# Patient Record
Sex: Male | Born: 1980 | Race: White | Hispanic: No | Marital: Married | Smoking: Never smoker
Health system: Southern US, Community
[De-identification: ages and names within clinical notes are randomized; demographics above are authoritative.]

## PROBLEM LIST (undated history)

## (undated) DIAGNOSIS — N200 Calculus of kidney: Secondary | ICD-10-CM

## (undated) HISTORY — PX: ORCHIECTOMY: SHX2116

---

## 2002-04-30 HISTORY — PX: LITHOTRIPSY: SUR834

## 2018-01-14 ENCOUNTER — Encounter: Payer: Self-pay | Admitting: Emergency Medicine

## 2018-01-14 ENCOUNTER — Emergency Department
Admission: EM | Admit: 2018-01-14 | Discharge: 2018-01-14 | Disposition: A | Discharge status: Home / Self Care | Payer: Self-pay | Attending: Emergency Medicine | Admitting: Emergency Medicine

## 2018-01-14 ENCOUNTER — Emergency Department: Payer: Self-pay

## 2018-01-14 DIAGNOSIS — N23 Unspecified renal colic: Secondary | ICD-10-CM | POA: Insufficient documentation

## 2018-01-14 HISTORY — DX: Calculus of kidney: N20.0

## 2018-01-14 LAB — CBC
HCT: 43.3 % (ref 40.0–52.0)
Hemoglobin: 14.6 g/dL (ref 13.0–18.0)
MCH: 29.7 pg (ref 26.0–34.0)
MCHC: 33.7 g/dL (ref 32.0–36.0)
MCV: 88 fL (ref 80.0–100.0)
PLATELETS: 213 10*3/uL (ref 150–440)
RBC: 4.92 MIL/uL (ref 4.40–5.90)
RDW: 14.5 % (ref 11.5–14.5)
WBC: 11.8 10*3/uL — ABNORMAL HIGH (ref 3.8–10.6)

## 2018-01-14 LAB — COMPREHENSIVE METABOLIC PANEL
ALK PHOS: 66 U/L (ref 38–126)
ALT: 26 U/L (ref 0–44)
AST: 23 U/L (ref 15–41)
Albumin: 4.4 g/dL (ref 3.5–5.0)
Anion gap: 11 (ref 5–15)
BUN: 18 mg/dL (ref 6–20)
CALCIUM: 9.4 mg/dL (ref 8.9–10.3)
CO2: 26 mmol/L (ref 22–32)
CREATININE: 1.06 mg/dL (ref 0.61–1.24)
Chloride: 102 mmol/L (ref 98–111)
GFR calc non Af Amer: >60 mL/min (ref >60)
GLUCOSE: 99 mg/dL (ref 70–99)
Potassium: 3.8 mmol/L (ref 3.5–5.1)
SODIUM: 139 mmol/L (ref 135–145)
Total Bilirubin: 0.7 mg/dL (ref 0.3–1.2)
Total Protein: 7.9 g/dL (ref 6.5–8.1)

## 2018-01-14 LAB — URINALYSIS, COMPLETE (UACMP) WITH MICROSCOPIC
Bacteria, UA: NONE SEEN
Bilirubin Urine: NEGATIVE
Glucose, UA: NEGATIVE mg/dL
Ketones, ur: NEGATIVE mg/dL
NITRITE: NEGATIVE
Protein, ur: 30 mg/dL — AB
RBC / HPF: >50 RBC/hpf — ABNORMAL HIGH (ref 0–5)
SPECIFIC GRAVITY, URINE: 1.025 (ref 1.005–1.030)
Squamous Epithelial / LPF: NONE SEEN (ref 0–5)
pH: 6 (ref 5.0–8.0)

## 2018-01-14 MED ORDER — ONDANSETRON HCL 4 MG/2ML IJ SOLN
4.0000 mg | Freq: Once | INTRAMUSCULAR | Status: AC
  Administered 2018-01-14: 4 mg via INTRAVENOUS
  Filled 2018-01-14: qty 2

## 2018-01-14 MED ORDER — TAMSULOSIN HCL 0.4 MG PO CAPS: 0.4000 mg | ORAL_CAPSULE | Freq: Every day | ORAL | 0 refills | Status: DC

## 2018-01-14 MED ORDER — OXYCODONE-ACETAMINOPHEN 5-325 MG PO TABS: 1.0000 | ORAL_TABLET | Freq: Three times a day (TID) | ORAL | 0 refills | Status: DC | PRN

## 2018-01-14 MED ORDER — MORPHINE SULFATE (PF) 4 MG/ML IV SOLN
4.0000 mg | Freq: Once | INTRAVENOUS | Status: AC
  Administered 2018-01-14: 4 mg via INTRAVENOUS
  Filled 2018-01-14: qty 1

## 2018-01-14 MED ORDER — ONDANSETRON 4 MG PO TBDP: 4.0000 mg | ORAL_TABLET | Freq: Three times a day (TID) | ORAL | 0 refills | Status: DC | PRN

## 2018-01-14 MED ORDER — KETOROLAC TROMETHAMINE 30 MG/ML IJ SOLN
30.0000 mg | Freq: Once | INTRAMUSCULAR | Status: AC
  Administered 2018-01-14: 30 mg via INTRAVENOUS
  Filled 2018-01-14: qty 1

## 2018-01-14 NOTE — ED Notes (Addendum)
Water given. OK per EDP.  

## 2018-01-14 NOTE — ED Notes (Signed)
Pt ambulatory to treatment room with no distress noted. Family with pt.

## 2018-01-14 NOTE — ED Notes (Signed)
Pt returned from CT °

## 2018-01-14 NOTE — ED Notes (Signed)
Pt taken ambulatory to xray

## 2018-01-14 NOTE — ED Notes (Signed)
Pt taken to CT at this time.

## 2018-01-14 NOTE — ED Provider Notes (Signed)
[facility] Emergency Department Provider Note       Time seen: ----------------------------------------- 10:45 AM on 01/14/2018 -----------------------------------------   I have reviewed the triage vital signs and the nursing notes.  HISTORY   Chief Complaint Back Pain    HPI Alex Baldwin is a 37 y.o. male with a history of kidney stones who presents to the ED for left flank pain.  Patient reports back pain on his left side for the past several days.  He reports history of kidney stones and feels certain that he has another one.  He describes sharp pain in the left side with nausea although he is not nauseous currently.  Nothing makes his pain better.  Past Medical History:  Diagnosis Date  . Kidney stones     There are no active problems to display for this patient.   History reviewed. No pertinent surgical history.  Allergies Patient has no allergy information on record.  Social History Social History   Tobacco Use  . Smoking status: Not on file  Substance Use Topics  . Alcohol use: Not on file  . Drug use: Not on file   Review of Systems Constitutional: Negative for fever. Cardiovascular: Negative for chest pain. Respiratory: Negative for shortness of breath. Gastrointestinal: Positive for flank pain Genitourinary: Negative for dysuria. Musculoskeletal: Positive for back pain Skin: Negative for rash. Neurological: Negative for headaches, focal weakness or numbness.  All systems negative/normal/unremarkable except as stated in the HPI  ____________________________________________   PHYSICAL EXAM:  VITAL SIGNS: ED Triage Vitals [01/14/18 1007]  Enc Vitals Group     BP 114/88     Pulse Rate 65     Resp 20     Temp 97.8 F (36.6 C)     Temp Source Oral     SpO2 100 %     Weight 210 lb (95.3 kg)     Height 5\' 9"  (1.753 m)     Head Circumference      Peak Flow      Pain Score 6     Pain Loc      Pain Edu?      Excl.  in [place]?    Constitutional: Alert and oriented.  Mild distress from pain Eyes: Conjunctivae are normal. Normal extraocular movements. Cardiovascular: Normal rate, regular rhythm. No murmurs, rubs, or gallops. Respiratory: Normal respiratory effort without tachypnea nor retractions. Breath sounds are clear and equal bilaterally. No wheezes/rales/rhonchi. Gastrointestinal: Mild left flank tenderness, Normal bowel sounds. Musculoskeletal: Nontender with normal range of motion in extremities. No lower extremity tenderness nor edema. Neurologic:  Normal speech and language. No gross focal neurologic deficits are appreciated.  Skin:  Skin is warm, dry and intact. No rash noted. Psychiatric: Mood and affect are normal. Speech and behavior are normal.  ____________________________________________  ED COURSE:  As part of my medical decision making, I reviewed the following data within the [place] History obtained from family if available, nursing notes, old chart and ekg, as well as notes from prior ED visits. Patient presented for flank pain, we will assess with labs and imaging as indicated at this time.   Procedures ____________________________________________   LABS (pertinent positives/negatives)  Labs Reviewed  URINALYSIS, COMPLETE (UACMP) WITH MICROSCOPIC - Abnormal; Notable for the following components:      Result Value   Color, Urine YELLOW (*)    APPearance CLEAR (*)    Hgb urine dipstick LARGE (*)    Protein, ur 30 (*)  Leukocytes, UA TRACE (*)    RBC / HPF >50 (*)    All other components within normal limits  CBC - Abnormal; Notable for the following components:   WBC 11.8 (*)    All other components within normal limits  COMPREHENSIVE METABOLIC PANEL    RADIOLOGY Images were viewed by me  Abdomen 2 view IMPRESSION: 1. A 10.4 mm calcific density again noted over the left mid flank consistent with a mid left ureteral stone.  2.  Right  nephrolithiasis. IMPRESSION: 1. Moderate left hydronephrosis caused by a mid proximal left ureteral calculus of 7 x 9 mm. 2. Bilateral renal calculi the larger and more numerous on the right. ____________________________________________  DIFFERENTIAL DIAGNOSIS   Renal colic, UTI, pyelonephritis, muscle strain  FINAL ASSESSMENT AND PLAN  Renal colic   Plan: The patient had presented for flank pain. Patient's labs revealed hematuria as expected and mild leukocytosis but was otherwise unremarkable. Patient's imaging did reveal a very large kidney stone of 7 x 9 mm which it is doubtful that he will pass.  I will discuss with urology and refer for outpatient follow-up with symptomatic treatment.   Laurence Aly, MD   Note: This note was generated in part or whole with voice recognition software. Voice recognition is usually quite accurate but there are transcription errors that can and very often do occur. I apologize for any typographical errors that were not detected and corrected.     Earleen Newport, MD 01/14/18 [phone]

## 2018-01-14 NOTE — ED Triage Notes (Signed)
Pt reports back pain to his lower left side for several days. Pt states has a hx of kidney stones and is pretty certain that he has another kidney stone.

## 2018-01-14 NOTE — ED Notes (Signed)
Pt given saltine crackers. 

## 2018-01-20 ENCOUNTER — Ambulatory Visit (INDEPENDENT_AMBULATORY_CARE_PROVIDER_SITE_OTHER): Payer: Self-pay | Admitting: Urology

## 2018-01-20 ENCOUNTER — Other Ambulatory Visit: Payer: Self-pay | Admitting: Radiology

## 2018-01-20 ENCOUNTER — Encounter: Payer: Self-pay | Admitting: Urology

## 2018-01-20 VITALS — BP 117/82 | HR 83 | Ht 69.0 in | Wt 201.4 lb

## 2018-01-20 DIAGNOSIS — N200 Calculus of kidney: Secondary | ICD-10-CM

## 2018-01-20 DIAGNOSIS — N132 Hydronephrosis with renal and ureteral calculous obstruction: Secondary | ICD-10-CM

## 2018-01-20 DIAGNOSIS — N201 Calculus of ureter: Secondary | ICD-10-CM

## 2018-01-20 DIAGNOSIS — R3129 Other microscopic hematuria: Secondary | ICD-10-CM

## 2018-01-20 LAB — URINALYSIS, COMPLETE
Bilirubin, UA: NEGATIVE
GLUCOSE, UA: NEGATIVE
KETONES UA: NEGATIVE
NITRITE UA: NEGATIVE
Protein, UA: NEGATIVE
Specific Gravity, UA: 1.015 (ref 1.005–1.030)
Urobilinogen, Ur: 0.2 mg/dL (ref 0.2–1.0)
pH, UA: 6.5 (ref 5.0–7.5)

## 2018-01-20 LAB — MICROSCOPIC EXAMINATION

## 2018-01-20 MED ORDER — OXYCODONE-ACETAMINOPHEN 5-325 MG PO TABS: 1.0000 | ORAL_TABLET | Freq: Three times a day (TID) | ORAL | 0 refills | Status: DC | PRN

## 2018-01-20 NOTE — Progress Notes (Signed)
01/20/2018 9:08 AM   Burnetta Sabin 12-20-1980 [id number]  Referring provider: No referring provider defined for this encounter.  Chief Complaint  Patient presents with  . Nephrolithiasis    follow up    HPI: Patient is a 37 year old Caucasian male who was referred by [facility] ED for nephrolithiasis.    He was seen in the ED on 01/14/2018 for the complaint of left flank pain and nausea.  CT Renal stone revealed moderate left hydronephrosis caused by a mid proximal left ureteral calculus of 7 x 9 mm.  Bilateral renal calculi the larger and more numerous on the right.    Labs in the ED:  UA was positive for > 50 RBC's and 11-20 WBC's, serum creatinine was 1.06 and WBC count was 11.8.     Meds given in the ED: He was given Toradol IM, morphine IM, Zofran IM and p.o., tamsulosin 0.4 mg and Percocet.  Prior urological history:  ESWL in 2004 in [place].  He is also passing stones spontaneously.     Current NSAID/anticoagulation:  None   Today, he last had left flank pain yesterday afternoon.  He is controlling the pain with the Percocet.  He is having gross hematuria.  He is having associated frequency and nocturia with the increase in water.    His stone composition is unknown.    His UA is positive for 6-10 WBC's, 11-30 RBC's and a few bacteria.     PMH: Past Medical History:  Diagnosis Date  . Kidney stones     Surgical History: Past Surgical History:  Procedure Laterality Date  . LITHOTRIPSY  2004    Home Medications:  Allergies as of 01/20/2018   Not on File     Medication List        Accurate as of 01/20/18 11:59 PM. Always use your most recent med list.          ondansetron 4 MG disintegrating tablet Commonly known as:  ZOFRAN-ODT Take 1 tablet (4 mg total) by mouth every 8 (eight) hours as needed for nausea or vomiting.   oxyCODONE-acetaminophen 5-325 MG tablet Commonly known as:  PERCOCET/ROXICET Take 1 tablet by mouth every 8 (eight) hours as  needed.   tamsulosin 0.4 MG Caps capsule Commonly known as:  FLOMAX Take 1 capsule (0.4 mg total) by mouth daily after breakfast.       Allergies: Not on File  Family History: No family history on file.  Social History:  reports that he has never smoked. He has never used smokeless tobacco. He reports that he does not drink alcohol. His drug history is not on file.  ROS: UROLOGY Frequent Urination?: Yes Hard to postpone urination?: No Burning/pain with urination?: No Get up at night to urinate?: Yes Leakage of urine?: No Urine stream starts and stops?: No Trouble starting stream?: No Do you have to strain to urinate?: No Blood in urine?: Yes Urinary tract infection?: No Sexually transmitted disease?: No Injury to kidneys or bladder?: No Painful intercourse?: No Weak stream?: No Erection problems?: No Penile pain?: No  Gastrointestinal Nausea?: Yes Vomiting?: Yes Indigestion/heartburn?: No Diarrhea?: No Constipation?: No  Constitutional Fever: No Night sweats?: No Weight loss?: No Fatigue?: No  Skin Skin rash/lesions?: No Itching?: No  Eyes Blurred vision?: No Double vision?: No  Ears/Nose/Throat Sore throat?: No Sinus problems?: No  Hematologic/Lymphatic Swollen glands?: No Easy bruising?: No  Cardiovascular Leg swelling?: No Chest pain?: No  Respiratory Cough?: Yes Shortness of breath?: No  Endocrine  Excessive thirst?: No  Musculoskeletal Back pain?: Yes Joint pain?: No  Neurological Headaches?: No Dizziness?: No  Psychologic Depression?: No Anxiety?: No  Physical Exam: BP 117/82 (BP Location: Left Arm, Patient Position: Sitting, Cuff Size: Normal)   Pulse 83   Ht _0  (1.753 m)   Wt 201 lb 6.4 oz (91.4 kg)   BMI 29.74 kg/m   Constitutional:  Well nourished. Alert and oriented, No acute distress. HEENT: [place] AT, moist mucus membranes.  Trachea midline, no masses. Cardiovascular: No clubbing, cyanosis, or  edema. Respiratory: Normal respiratory effort, no increased work of breathing. GI: Abdomen is soft, non tender, non distended, no abdominal masses. Liver and spleen not palpable.  No hernias appreciated.  Stool sample for occult testing is not indicated.   GU: No CVA tenderness.  No bladder fullness or masses.   Skin: No rashes, bruises or suspicious lesions. Lymph: No cervical or inguinal adenopathy. Neurologic: Grossly intact, no focal deficits, moving all 4 extremities. Psychiatric: Normal mood and affect.  Laboratory Data: Lab Results  Component Value Date   WBC 11.8 (H) 01/14/2018   HGB 14.6 01/14/2018   HCT 43.3 01/14/2018   MCV 88.0 01/14/2018   PLT 213 01/14/2018    Lab Results  Component Value Date   CREATININE 1.06 01/14/2018    No results found for: PSA  No results found for: TESTOSTERONE  No results found for: HGBA1C  No results found for: TSH  No results found for: CHOL, HDL, CHOLHDL, VLDL, LDLCALC  Lab Results  Component Value Date   AST 23 01/14/2018   Lab Results  Component Value Date   ALT 26 01/14/2018   No components found for: ALKALINEPHOPHATASE No components found for: BILIRUBINTOTAL  No results found for: ESTRADIOL  Urinalysis 6-10 WBC's and 11-30 RBC's.  See Epic.    I have reviewed the labs.   Pertinent Imaging: CLINICAL DATA:  Back pain radiating to the left for several days, history of kidney stone, no injury  EXAM: CT ABDOMEN AND PELVIS WITHOUT CONTRAST  TECHNIQUE: Multidetector CT imaging of the abdomen and pelvis was performed following the standard protocol without IV contrast.  COMPARISON:  Abdomen films of 01/14/2017  FINDINGS: Lower chest: There is linear scarring or atelectasis in the left lower lobe anteriorly. No pneumonia or pleural effusion is seen. The heart is within normal limits in size.  Hepatobiliary: The liver is unremarkable in the unenhanced state. No calcified gallstones are  seen.  Pancreas: The pancreas is normal in size and the pancreatic duct is not dilated.  Spleen: The spleen is unremarkable.  Adrenals/Urinary Tract: The adrenal glands appear normal. Bilateral renal calculi are present. The larger calculi on the left measure 9 mm in diameter in the mid and in the lower pole collecting system. No right hydronephrosis is seen. Only small left renal calculi are noted. However there is moderate left hydronephrosis present with hydroureter to point of obstruction by a 7 x 9 mm mid proximal ureteral calculus. Otherwise the distal ureters are normal in caliber. The urinary bladder is not well distended.  Stomach/Bowel: The stomach is distended with fluid and food debris. No abnormality of the small bowel is seen. There is some feces throughout the colon in the left colon is largely decompressed. The terminal ileum is unremarkable. The appendix is not well seen but no inflammatory process is noted within the right lower quadrant.  Vascular/Lymphatic: The abdominal aorta is normal in caliber. No adenopathy is seen.  Reproductive: The prostate is  normal in size.  Other: No abdominal wall hernia is noted.  Musculoskeletal: The lumbar vertebrae are in normal alignment with normal intervertebral disc spaces.  IMPRESSION: 1. Moderate left hydronephrosis caused by a mid proximal left ureteral calculus of 7 x 9 mm. 2. Bilateral renal calculi the larger and more numerous on the right.   Electronically Signed   By: Ivar Drape M.D.   On: 01/14/2018 11:39  I have independently reviewed the films and reviewed case/films with Dr. Diamantina Providence   Assessment & Plan:   Patient is a 37 year old Caucasian male with an obstructing left proximal ureteral calculi associated with bilateral renal calculi who is planned to undergo a bilateral ureteroscopy laser lithotripsy and bilateral ureteral stent placement for definitive treatment.   1. Left ureteral  stone We discussed that since the stone is ?10 mm, they may choose to continue observation and/or MET with ?-blockers  We discussed ESWL would be the procedure with the least morbidity and lowest complication rate, but URS has a greater stone-free rate in a single procedure We dicussed that since the stone is in the mid or distal ureter, URS would be first-line therapy, but ESWL is still an option URS is recommended for patients with suspected cystine or uric acid ureteral stones who fail MET or desire intervention  Due to financial concerns, patient would like to proceed with bilateral ureteroscopy with laser lithotripsy and bilateral ureteral stent placement Explained to the patient how the procedure is performed and the risks involved Informed patient that they will have a stent placed during the procedure and will remain in place after the procedure for a short time.  Stent may be removed in the office with a cystoscope or patient may be instructed to remove the stent themselves by the string Described "stent pain" as feelings of needing to urinate/overactive bladder and a warm, tingling sensation to intense pain in the affected flank Residual stones within the kidney or ureter may be present after the procedure and may need to have these addressed at a different encounter Injury to the ureter is the most common intra-operative risk, it may result in an open procedure to correct the defect Infection and bleeding are also risks Explained the risks of general anesthesia, such as: MI, CVA, paralysis, coma and/or death. Advised to contact our office or seek treatment in the ED if becomes febrile or pain/ vomiting are difficult control in order to arrange for emergent/urgent intervention Reviewing the [facility] controlled substance website his last narcotic prescription was January 14, 2018.  I have given a prescription for Percocet 5/325, #21 every 8 hours as needed for pain.  I advised him  only to take the medication as prescribed as it is potentially habit forming and a respiratory depressant.  He is also advised not to drive or operate dangerous equipment while taking the pain medicine, not to take it with other mood altering substances and not to make an important life decisions while taking the medication.   2. Left hydronephrosis  - obtain RUS to ensure the hydronephrosis has resolved once they have passed and/or recovered from procedure to ensure to iatrogenic hydronephrosis remains - it is explained to the patient that it is important to document resolution of the hydronephrosis as "silent hydronephrosis" can occur and cause damage and/or loss of the kidney  3. Microscopic hematuria  - UA today demonstrates 11-30 RBC's  - continue to monitor the patient's UA after the treatment/passage of the stone to ensure the hematuria  has resolved  - if hematuria persists, we will pursue a hematuria workup with CT Urogram and cystoscopy if appropriate.  4. Bilateral nephrolithiasis Bilateral URS/LL/ureteral stent placement planned Will offer 24 hour metabolic work up once stones have been treated   Return for bilateral URS/LL/ureteral stent .  These notes generated with voice recognition software. I apologize for typographical errors.  [name], PA-C  [facility] [address]  [place], [place] [address] [phone]

## 2018-01-22 LAB — CULTURE, URINE COMPREHENSIVE

## 2018-01-27 ENCOUNTER — Other Ambulatory Visit: Payer: Self-pay | Admitting: Radiology

## 2018-01-28 ENCOUNTER — Telehealth: Payer: Self-pay | Admitting: Radiology

## 2018-01-28 NOTE — Telephone Encounter (Signed)
Explained Alex Baldwin's note below to patient. Patient will bring stone to office & will discuss whether he wants to proceed with ureteroscopy or KUB.

## 2018-01-28 NOTE — Telephone Encounter (Signed)
Is he aware the the ESWL would only treat one side at a time.  We cannot shock both kidneys.  That is why bilateral URS was suggested, so that both kidneys would be addressed.  If he is not wanting to pursue URS at this time, we will need to have him have a KUB and then a follow up RUS if the KUB does not identify the left ureteral stone.

## 2018-01-28 NOTE — Telephone Encounter (Signed)
Called patient to confirm surgery date & appointment for pre-admission testing phone interview prior to surgery. Patient reports passing a stone this morning & asks if it is possible to do shockwave lithotripsy instead. If ureteroscopy is only option patient would like to postpone surgery indefinitely. Please advise.

## 2018-01-29 ENCOUNTER — Inpatient Hospital Stay: Admission: RE | Admit: 2018-01-29 | Payer: Self-pay | Source: Ambulatory Visit

## 2018-01-29 NOTE — Telephone Encounter (Signed)
Called patient to discuss his decision regarding surgery vs. KUB.  Patient states he brought his stone to the office & would not like further workup. He does not want to proceed with surgery or have imaging. Called patient back after speaking with [name] & [facility] stating the stone would need to be taken by the patient to a [facility] facility for analysis if that is what he wants to do & the cost will be $98. Per [name] explained that although the stone analysis will show [place] what the stone is made of, recommendations can only be made after a metabolic workup is performed which is much more costly. Also explained the risk of no follow up to ensure hydronephrosis has resolved up to and including renal atrophy. Asked that patient return call to discuss further.

## 2018-02-04 ENCOUNTER — Ambulatory Visit: Admission: RE | Admit: 2018-02-04 | Payer: Self-pay | Source: Ambulatory Visit

## 2018-02-04 ENCOUNTER — Encounter: Admission: RE | Payer: Self-pay | Source: Ambulatory Visit

## 2018-02-04 SURGERY — CYSTOSCOPY/URETEROSCOPY/HOLMIUM LASER/STENT PLACEMENT
Anesthesia: Choice | Laterality: Bilateral

## 2018-02-14 ENCOUNTER — Other Ambulatory Visit: Payer: Self-pay | Admitting: Urology

## 2018-11-13 ENCOUNTER — Other Ambulatory Visit: Payer: Self-pay

## 2018-11-13 DIAGNOSIS — Z20822 Contact with and (suspected) exposure to covid-19: Secondary | ICD-10-CM

## 2018-11-17 LAB — NOVEL CORONAVIRUS, NAA: SARS-CoV-2, NAA: NOT DETECTED

## 2020-06-23 ENCOUNTER — Ambulatory Visit (INDEPENDENT_AMBULATORY_CARE_PROVIDER_SITE_OTHER): Payer: Self-pay | Admitting: Urology

## 2020-06-23 ENCOUNTER — Other Ambulatory Visit: Payer: Self-pay | Admitting: Urology

## 2020-06-23 ENCOUNTER — Other Ambulatory Visit: Payer: Self-pay

## 2020-06-23 ENCOUNTER — Other Ambulatory Visit
Admission: RE | Admit: 2020-06-23 | Discharge: 2020-06-23 | Disposition: A | Discharge status: Home / Self Care | Payer: HRSA Program | Source: Ambulatory Visit | Attending: Urology | Admitting: Urology

## 2020-06-23 ENCOUNTER — Ambulatory Visit
Admission: RE | Admit: 2020-06-23 | Discharge: 2020-06-23 | Disposition: A | Discharge status: Home / Self Care | Payer: Self-pay | Source: Ambulatory Visit | Attending: Urology | Admitting: Urology

## 2020-06-23 ENCOUNTER — Encounter: Payer: Self-pay | Admitting: Urology

## 2020-06-23 VITALS — BP 130/74 | HR 86 | Ht 68.0 in | Wt 210.0 lb

## 2020-06-23 DIAGNOSIS — N5089 Other specified disorders of the male genital organs: Secondary | ICD-10-CM

## 2020-06-23 DIAGNOSIS — Z01812 Encounter for preprocedural laboratory examination: Secondary | ICD-10-CM | POA: Insufficient documentation

## 2020-06-23 DIAGNOSIS — Z20822 Contact with and (suspected) exposure to covid-19: Secondary | ICD-10-CM | POA: Insufficient documentation

## 2020-06-23 DIAGNOSIS — N50819 Testicular pain, unspecified: Secondary | ICD-10-CM

## 2020-06-23 LAB — URINALYSIS, COMPLETE
Bilirubin, UA: NEGATIVE
Glucose, UA: NEGATIVE
Ketones, UA: NEGATIVE
Leukocytes,UA: NEGATIVE
Nitrite, UA: NEGATIVE
Specific Gravity, UA: >1.03 — ABNORMAL HIGH (ref 1.005–1.030)
Urobilinogen, Ur: 0.2 mg/dL (ref 0.2–1.0)
pH, UA: 5.5 (ref 5.0–7.5)

## 2020-06-23 LAB — MICROSCOPIC EXAMINATION: Bacteria, UA: NONE SEEN

## 2020-06-23 NOTE — Patient Instructions (Addendum)
Abeloff's Clinical Oncology (6th ed., pp. [phone]). [place], [place]: Elsevier.">  Orchiectomy  An orchiectomy is the removal of one or both testicles. It is most often done to treat cancer of the testicles. Less commonly, it may be done in men with prostate cancer, or to prevent cancer in men whose testicles did not develop normally. An orchiectomy may also be needed when an injury to a testicle cannot be repaired. The testicles can be replaced with artificial testicles (prostheses). Tell a health care provider about:  Any allergies you have.  All medicines you are taking, including vitamins, herbs, eye drops, creams, and over-the-counter medicines.  Any problems you or family members have had with anesthetic medicines.  Any blood disorders you have.  Any surgeries you have had.  Any medical conditions you have. What are the risks? Generally, this is a safe procedure. However, problems may occur, including:  Infection.  Bleeding inside the sac that holds the testicles (scrotum). This is called a scrotal hematoma.  Damage to nearby structures or organs.  Discharge from the surgical site. What happens before the procedure? Staying hydrated Follow instructions from your health care provider about hydration, which may include:  Up to 2 hours before the procedure - you may continue to drink clear liquids, such as water, clear fruit juice, black coffee, and plain tea.   Eating and drinking restrictions Follow instructions from your health care provider about eating and drinking, which may include:  8 hours before the procedure - stop eating heavy meals or foods, such as meat, fried foods, or fatty foods.  6 hours before the procedure - stop eating light meals or foods, such as toast or cereal.  6 hours before the procedure - stop drinking milk or drinks that contain milk.  2 hours before the procedure - stop drinking clear liquids. Medicines Ask your health care provider  about:  Changing or stopping your regular medicines. This is especially important if you are taking diabetes medicines or blood thinners.  Taking medicines such as aspirin and ibuprofen. These medicines can thin your blood. Do not take these medicines unless your health care provider tells you to take them.  Taking over-the-counter medicines, vitamins, herbs, and supplements. General instructions  Plan to have someone take you home from the hospital or clinic.  Ask your health care provider: ? How your surgery site will be marked. ? What steps will be taken to help prevent infection. These steps may include:  Removing hair at the surgery site.  Washing skin with a germ-killing soap.  Taking antibiotic medicine. What happens during the procedure?  An IV will be inserted into one of your veins.  You will be given one or more of the following: ? A medicine to help you relax (sedative). ? A medicine to numb the area (local anesthetic). ? A medicine to make you fall asleep (general anesthetic).  This procedure may involve removal of one or both testicles. The steps for the procedure will depend on the reason for the procedure. ? If your procedure is for treatment of testicular cancer:  An incision will be made in the groin.  The testicle and the spermatic cord will be removed through the groin incision.  A prosthetic filled with saline may be inserted to fill the space in the scrotum where the testicle was removed. ? If your procedure is for treatment of prostate cancer:  An incision will be made in the scrotum.  The testicle will be removed through the  incision in the scrotum.  A prosthetic filled with saline may be inserted to fill the space in the scrotum where the testicle was removed. ? After the removal, the incision will be closed with stitches (sutures), skin glue, or adhesive strips. ? A sterile bandage (dressing) will be applied to the incision site. The procedure  may vary among health care providers and hospitals. What happens after the procedure?  Your blood pressure, heart rate, breathing rate, and blood oxygen level will be monitored until you leave the hospital or clinic.  Once you are awake, stable, and taking fluids well, without other problems, you will be allowed to go home.  You may have scrotal support. If the scrotal support irritates your incision site, you may remove the support.  You will have a sterile dressing. You will be instructed when to remove the dressing and when you can shower.  If you were given a sedative during the procedure, it can affect you for several hours. Do not drive or operate machinery until your health care provider says that it is safe. Summary  Orchiectomy is a surgical procedure to remove one or both testicles. It is most often done to treat cancer of the testicles.  Tell your health care provider about any other medical conditions that you have and the medicines that you take to treat those conditions.  Follow your health care provider's instructions about eating and drinking before the procedure and about changing or stopping any medicines.  One or both testicles will be removed. A prosthesis filled with saline may be inserted to fill the space in the scrotum where the testicle was removed.  You will be monitored closely after the procedure. Plan to have someone take you home from the hospital or clinic. This information is not intended to replace advice given to you by your health care provider. Make sure you discuss any questions you have with your health care provider. Document Revised: 03/13/2019 Document Reviewed: 03/13/2019 Elsevier Patient Education  2021 [employer].   Orchiectomy, Care After This sheet gives you information about how to care for yourself after your procedure. Your health care provider may also give you more specific instructions. If you have problems or questions, contact your  health care provider. What can I expect after the procedure? After the procedure, it is common to have:  Pain and bruising in your genital or groin area.  Some swelling and redness in your genital or groin area.  Fluid pooling in the area where your testicles were removed (seroma).  Depression or mood changes.  Fatigue.  Hot flashes. Follow these instructions at home: Managing pain and swelling  If directed, put ice on the affected area: ? Put ice in a plastic bag. ? Place a towel between your skin and the bag. ? Leave the ice on for 20 minutes, 2-3 times a day.  Wear scrotal support as told by your health care provider.  To relieve pressure and pain when sitting, you may use a donut cushion if directed by your health care provider. Incision care  Follow instructions from your health care provider about how to take care of your incision. Make sure you: ? Wash your hands with soap and water for at least 20 seconds before and after you change your bandage (dressing). If soap and water are not available, use hand sanitizer. ? Change your dressing once a day, or as often as told by your health care provider. If the dressing sticks to your incision  area:  Use warm, soapy water to dampen the dressing.  When the dressing becomes loose, lift it from the incision area. Make sure that the incision stays closed. ? Leave stitches (sutures), skin glue, or adhesive strips in place. These skin closures may need to stay in place for 2 weeks or longer. If adhesive strip edges start to loosen and curl up, you may trim the loose edges. Do not remove adhesive strips completely unless your health care provider tells you to do that.  Keep your dressing dry until it has been removed.  Check your incision area every day for signs of infection. Check for: ? More redness, swelling, or pain. ? More fluid or blood. ? Warmth. ? Pus or a bad smell.   Bathing  Do not take baths, swim, or use a hot tub  until your health care provider approves. You may start taking showers two days after your procedure.  Do not rub your incision to dry it. Pat the area gently with a clean cloth or let it air-dry. Medicines  Take over-the-counter and prescription medicines only as told by your health care provider.  If you had both testicles removed, talk with your health care provider about taking a medicine to replace testosterone, one of the male hormones that your body will no longer make.  Ask your health care provider if the medicine prescribed to you: ? Requires you to avoid driving or using machinery. ? Can cause constipation. You may need to take these actions to prevent or treat constipation:  Drink enough fluid to keep your urine pale yellow.  Take over-the-counter or prescription medicines.  Eat foods that are high in fiber, such as beans, whole grains, and fresh fruits and vegetables.  Limit foods that are high in fat and processed sugars, such as fried or sweet foods. Activity  If you were given a sedative during the procedure, it can affect you for several hours. Do not drive or operate machinery until your health care provider says that it is safe.  Avoid activities that may cause your incision to open, such as jogging, playing sports, and straining during a bowel movement. Ask your health care provider what activities are safe for you.  Do not lift anything that is heavier than 10 lb (4.5 kg), or the limit that you are told, until your health care provider says that it is safe.  Do not engage in sexual activity until the area is healed and your health care provider approves. This could take up to 4 weeks. General instructions  Do not use any products that contain nicotine or tobacco, such as cigarettes, e-cigarettes, and chewing tobacco. These can delay incision healing after surgery. If you need help quitting, ask your health care provider.  Keep all follow-up visits as told by your  health care provider. This is important. Contact a health care provider if:  You have more bruising.  You have any of these signs of infection: ? More pain, swelling, or redness in your genital or groin area. ? More fluid or blood coming from your incision. ? Warmth coming from your incision. ? Pus or a bad smell coming from your incision.  You have constipation that is not helped by changing your diet or drinking more fluid.  You develop nausea or vomiting.  You cannot eat or drink without vomiting. Get help right away if:  You have dizziness or nausea that does not go away.  You have trouble breathing.  You have  a wet (congested) cough.  You have a fever or shaking chills.  Your incision breaks open after the skin closures have been removed.  You are not able to urinate. Summary  After this procedure, it is common to have pain and bruising in your genital or groin area, or fluid pooling in the area where your testicles were removed.  You should check your incision area every day for signs of infection, such as more redness, swelling, or pain, more fluid or blood, warmth, pus, or a bad smell.  Avoid activities that may cause your incision to open, such as jogging, playing sports, and straining with bowel movements. Ask your health care provider what activities are safe for you.  You should not engage in sexual activity until the area is healed and your health care provider approves. This could take up to 4 weeks.  If you had both testicles removed, talk with your health care provider about taking a medicine to replace testosterone, one of the male hormones that your body will no longer make. This information is not intended to replace advice given to you by your health care provider. Make sure you discuss any questions you have with your health care provider. Document Revised: 03/13/2019 Document Reviewed: 03/13/2019 Elsevier Patient Education  2021 [employer].

## 2020-06-23 NOTE — H&P (View-Only) (Signed)
   06/23/2020 9:00 AM   Alex Baldwin 1980/12/23 [id number]  Reason for visit: Right testicular mass  HPI: Alex Baldwin is a 40 year old male who was previously seen in September 2019 by Alex Council, PA for a 8 mm left ureteral stone that he felt passed spontaneously.  He did not follow-up as recommended for a KUB or renal ultrasound to confirm clearance of the stone, and had significant right-sided stone burden as well at that time.  He is seen in clinic today for 3 to 4 weeks of a new right testicular mass that is painless.  He denies any trauma, fevers, chills, night sweats, or other systemic symptoms.  He denies any urinary symptoms of gross hematuria, dysuria, or urgency/frequency.  He denies any weight loss or weight gain.  He denies any family history of any urologic malignancies.  He has a history of vasectomy 8 years ago, and they have 4 children.  They do not desire further pregnancies.  Urinalysis today with 11-30 WBCs, 3-10 RBCs, no bacteria, no yeast, nitrite negative, no leukocytes.  Exam: Alert, oriented, conversational Cardiac: Regular rate and rhythm Lungs: Clear to auscultation bilaterally Abdomen soft, nontender, nondistended Phallus with patent meatus, right testicle grossly abnormal firm and irregular worrisome for malignancy, left testicle without masses  Clinical presentation was concerning for a testicular tumor, and I sent him over for a stat scrotal ultrasound.  I personally reviewed and interpreted the results that shows a 4 cm complex and heterogenous mass in the right testicle concerning for neoplasm.  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  We reviewed that there are multiple different types of malignancy in the testicle, and standard of care would be radical orchiectomy and staging imaging with likely CT of the chest abdomen and pelvis.  Tumor markers were drawn today including  LDH, AFP, and beta-hCG.  We discussed the risks and benefits of surgery including bleeding, infection, recurrence, need for additional procedures in the future pending pathology results, and likely need for long-term surveillance.  He and his wife had a number of excellent questions today which I answered to the best of my ability.  Thanks have a good understanding of his disease process and the plan moving forward.  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  Billey Co, [facility] [address] [place], [place] [address] [phone]

## 2020-06-23 NOTE — Progress Notes (Signed)
   06/23/2020 9:00 AM   Alex Baldwin 1981/01/20 [id number]  Reason for visit: Right testicular mass  HPI: Alex Baldwin is a 40 year old male who was previously seen in September 2019 by Alex Council, PA for a 8 mm left ureteral stone that he felt passed spontaneously.  He did not follow-up as recommended for a KUB or renal ultrasound to confirm clearance of the stone, and had significant right-sided stone burden as well at that time.  He is seen in clinic today for 3 to 4 weeks of a new right testicular mass that is painless.  He denies any trauma, fevers, chills, night sweats, or other systemic symptoms.  He denies any urinary symptoms of gross hematuria, dysuria, or urgency/frequency.  He denies any weight loss or weight gain.  He denies any family history of any urologic malignancies.  He has a history of vasectomy 8 years ago, and they have 4 children.  They do not desire further pregnancies.  Urinalysis today with 11-30 WBCs, 3-10 RBCs, no bacteria, no yeast, nitrite negative, no leukocytes.  Exam: Alert, oriented, conversational Cardiac: Regular rate and rhythm Lungs: Clear to auscultation bilaterally Abdomen soft, nontender, nondistended Phallus with patent meatus, right testicle grossly abnormal firm and irregular worrisome for malignancy, left testicle without masses  Clinical presentation was concerning for a testicular tumor, and I sent him over for a stat scrotal ultrasound.  I personally reviewed and interpreted the results that shows a 4 cm complex and heterogenous mass in the right testicle concerning for neoplasm.  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  We reviewed that there are multiple different types of malignancy in the testicle, and standard of care would be radical orchiectomy and staging imaging with likely CT of the chest abdomen and pelvis.  Tumor markers were drawn today including  LDH, AFP, and beta-hCG.  We discussed the risks and benefits of surgery including bleeding, infection, recurrence, need for additional procedures in the future pending pathology results, and likely need for long-term surveillance.  He and his wife had a number of excellent questions today which I answered to the best of my ability.  Thanks have a good understanding of his disease process and the plan moving forward.  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  Alex Baldwin, [facility] [address], [place], [place] [address] [phone]

## 2020-06-24 ENCOUNTER — Encounter: Payer: Self-pay | Admitting: *Deleted

## 2020-06-24 ENCOUNTER — Encounter
Admission: RE | Disposition: A | Discharge status: Home / Self Care | Payer: Self-pay | Source: Home / Self Care | Attending: Urology

## 2020-06-24 ENCOUNTER — Ambulatory Visit: Payer: Self-pay | Admitting: Anesthesiology

## 2020-06-24 ENCOUNTER — Ambulatory Visit
Admission: RE | Admit: 2020-06-24 | Discharge: 2020-06-24 | Disposition: A | Discharge status: Home / Self Care | Payer: Self-pay | Attending: Urology | Admitting: Urology

## 2020-06-24 ENCOUNTER — Other Ambulatory Visit: Payer: Self-pay

## 2020-06-24 ENCOUNTER — Encounter: Payer: Self-pay | Admitting: Radiology

## 2020-06-24 DIAGNOSIS — N5089 Other specified disorders of the male genital organs: Secondary | ICD-10-CM

## 2020-06-24 DIAGNOSIS — D0769 Carcinoma in situ of other male genital organs: Secondary | ICD-10-CM | POA: Insufficient documentation

## 2020-06-24 DIAGNOSIS — D4011 Neoplasm of uncertain behavior of right testis: Secondary | ICD-10-CM

## 2020-06-24 HISTORY — PX: ORCHIECTOMY: SHX2116

## 2020-06-24 LAB — CBC
HCT: 44.1 % (ref 39.0–52.0)
Hemoglobin: 14.6 g/dL (ref 13.0–17.0)
MCH: 29 pg (ref 26.0–34.0)
MCHC: 33.1 g/dL (ref 30.0–36.0)
MCV: 87.5 fL (ref 80.0–100.0)
Platelets: 222 10*3/uL (ref 150–400)
RBC: 5.04 MIL/uL (ref 4.22–5.81)
RDW: 14.1 % (ref 11.5–15.5)
WBC: 6.8 10*3/uL (ref 4.0–10.5)
nRBC: 0 % (ref 0.0–0.2)

## 2020-06-24 LAB — BASIC METABOLIC PANEL
Anion gap: 8 (ref 5–15)
BUN: 22 mg/dL — ABNORMAL HIGH (ref 6–20)
CO2: 24 mmol/L (ref 22–32)
Calcium: 9.4 mg/dL (ref 8.9–10.3)
Chloride: 106 mmol/L (ref 98–111)
Creatinine, Ser: 0.76 mg/dL (ref 0.61–1.24)
GFR, Estimated: >60 mL/min (ref >60)
Glucose, Bld: 92 mg/dL (ref 70–99)
Potassium: 3.9 mmol/L (ref 3.5–5.1)
Sodium: 138 mmol/L (ref 135–145)

## 2020-06-24 LAB — BETA HCG QUANT (REF LAB): hCG Quant: 4 m[IU]/mL — ABNORMAL HIGH (ref 0–3)

## 2020-06-24 LAB — SARS CORONAVIRUS 2 (TAT 6-24 HRS): SARS Coronavirus 2: NEGATIVE

## 2020-06-24 LAB — LACTATE DEHYDROGENASE: LDH: 175 IU/L (ref 121–224)

## 2020-06-24 LAB — AFP TUMOR MARKER: AFP, Serum, Tumor Marker: 221 ng/mL — ABNORMAL HIGH (ref 0.0–8.3)

## 2020-06-24 SURGERY — ORCHIECTOMY
Anesthesia: General | Laterality: Right

## 2020-06-24 MED ORDER — BUPIVACAINE HCL (PF) 0.5 % IJ SOLN
INTRAMUSCULAR | Status: AC
  Filled 2020-06-24: qty 30

## 2020-06-24 MED ORDER — CEFAZOLIN SODIUM-DEXTROSE 2-4 GM/100ML-% IV SOLN
2.0000 g | INTRAVENOUS | Status: AC
  Administered 2020-06-24: 2 g via INTRAVENOUS

## 2020-06-24 MED ORDER — LIDOCAINE HCL (CARDIAC) PF 100 MG/5ML IV SOSY
PREFILLED_SYRINGE | INTRAVENOUS | Status: DC | PRN
  Administered 2020-06-24: 100 mg via INTRAVENOUS

## 2020-06-24 MED ORDER — CEFAZOLIN SODIUM-DEXTROSE 2-4 GM/100ML-% IV SOLN
INTRAVENOUS | Status: AC
  Filled 2020-06-24: qty 100

## 2020-06-24 MED ORDER — FENTANYL CITRATE (PF) 100 MCG/2ML IJ SOLN: 25.0000 ug | INTRAMUSCULAR | Status: DC | PRN

## 2020-06-24 MED ORDER — BUPIVACAINE-MELOXICAM ER 400-12 MG/14ML IJ SOLN
INTRAMUSCULAR | Status: AC
  Filled 2020-06-24: qty 1

## 2020-06-24 MED ORDER — MIDAZOLAM HCL 2 MG/2ML IJ SOLN
INTRAMUSCULAR | Status: DC | PRN
  Administered 2020-06-24: 2 mg via INTRAVENOUS

## 2020-06-24 MED ORDER — FAMOTIDINE 20 MG PO TABS
ORAL_TABLET | ORAL | Status: AC
  Filled 2020-06-24: qty 1

## 2020-06-24 MED ORDER — FENTANYL CITRATE (PF) 100 MCG/2ML IJ SOLN
INTRAMUSCULAR | Status: DC | PRN
  Administered 2020-06-24: 100 ug via INTRAVENOUS
  Administered 2020-06-24: 50 ug via INTRAVENOUS

## 2020-06-24 MED ORDER — MIDAZOLAM HCL 2 MG/2ML IJ SOLN
INTRAMUSCULAR | Status: AC
  Filled 2020-06-24: qty 2

## 2020-06-24 MED ORDER — ACETAMINOPHEN 10 MG/ML IV SOLN
INTRAVENOUS | Status: AC
  Filled 2020-06-24: qty 100

## 2020-06-24 MED ORDER — PHENYLEPHRINE HCL (PRESSORS) 10 MG/ML IV SOLN
INTRAVENOUS | Status: DC | PRN
  Administered 2020-06-24: 100 ug via INTRAVENOUS

## 2020-06-24 MED ORDER — PROPOFOL 10 MG/ML IV BOLUS
INTRAVENOUS | Status: AC
  Filled 2020-06-24: qty 60

## 2020-06-24 MED ORDER — ROCURONIUM BROMIDE 100 MG/10ML IV SOLN
INTRAVENOUS | Status: DC | PRN
  Administered 2020-06-24: 50 mg via INTRAVENOUS

## 2020-06-24 MED ORDER — DEXAMETHASONE SODIUM PHOSPHATE 10 MG/ML IJ SOLN
INTRAMUSCULAR | Status: DC | PRN
  Administered 2020-06-24: 10 mg via INTRAVENOUS

## 2020-06-24 MED ORDER — LACTATED RINGERS IV SOLN: INTRAVENOUS | Status: DC

## 2020-06-24 MED ORDER — PROPOFOL 10 MG/ML IV BOLUS
INTRAVENOUS | Status: DC | PRN
  Administered 2020-06-24: 180 mg via INTRAVENOUS

## 2020-06-24 MED ORDER — ONDANSETRON HCL 4 MG/2ML IJ SOLN: 4.0000 mg | Freq: Once | INTRAMUSCULAR | Status: AC | PRN

## 2020-06-24 MED ORDER — SUGAMMADEX SODIUM 200 MG/2ML IV SOLN
INTRAVENOUS | Status: DC | PRN
  Administered 2020-06-24: 200 mg via INTRAVENOUS

## 2020-06-24 MED ORDER — ONDANSETRON HCL 4 MG/2ML IJ SOLN
INTRAMUSCULAR | Status: DC | PRN
  Administered 2020-06-24: 4 mg via INTRAVENOUS

## 2020-06-24 MED ORDER — ONDANSETRON HCL 4 MG/2ML IJ SOLN
INTRAMUSCULAR | Status: AC
  Administered 2020-06-24: 4 mg via INTRAVENOUS
  Filled 2020-06-24: qty 2

## 2020-06-24 MED ORDER — FAMOTIDINE 20 MG PO TABS
20.0000 mg | ORAL_TABLET | Freq: Once | ORAL | Status: AC
  Administered 2020-06-24: 20 mg via ORAL

## 2020-06-24 MED ORDER — BUPIVACAINE-MELOXICAM ER 400-12 MG/14ML IJ SOLN
INTRAMUSCULAR | Status: DC | PRN
  Administered 2020-06-24: 9 mL

## 2020-06-24 MED ORDER — ORAL CARE MOUTH RINSE: 15.0000 mL | Freq: Once | OROMUCOSAL | Status: AC

## 2020-06-24 MED ORDER — ACETAMINOPHEN 10 MG/ML IV SOLN
INTRAVENOUS | Status: DC | PRN
  Administered 2020-06-24: 1000 mg via INTRAVENOUS

## 2020-06-24 MED ORDER — CHLORHEXIDINE GLUCONATE 0.12 % MT SOLN
15.0000 mL | Freq: Once | OROMUCOSAL | Status: AC
  Administered 2020-06-24: 15 mL via OROMUCOSAL

## 2020-06-24 MED ORDER — HYDROCODONE-ACETAMINOPHEN 5-325 MG PO TABS: 1.0000 | ORAL_TABLET | Freq: Four times a day (QID) | ORAL | 0 refills | Status: AC | PRN

## 2020-06-24 MED ORDER — CHLORHEXIDINE GLUCONATE 0.12 % MT SOLN
OROMUCOSAL | Status: AC
  Filled 2020-06-24: qty 15

## 2020-06-24 MED ORDER — FENTANYL CITRATE (PF) 250 MCG/5ML IJ SOLN
INTRAMUSCULAR | Status: AC
  Filled 2020-06-24: qty 5

## 2020-06-24 SURGICAL SUPPLY — 45 items
ADH SKN CLS APL DERMABOND .7 (GAUZE/BANDAGES/DRESSINGS) ×1
APL PRP STRL LF DISP 70% ISPRP (MISCELLANEOUS) ×1
BLADE SURG 15 STRL LF DISP TIS (BLADE) ×1 IMPLANT
BLADE SURG 15 STRL SS (BLADE) ×2
CANISTER SUCT 1200ML W/VALVE (MISCELLANEOUS) ×2 IMPLANT
CHLORAPREP W/TINT 26 (MISCELLANEOUS) ×2 IMPLANT
COVER WAND RF STERILE (DRAPES) ×2 IMPLANT
DERMABOND ADVANCED (GAUZE/BANDAGES/DRESSINGS) ×1
DERMABOND ADVANCED .7 DNX12 (GAUZE/BANDAGES/DRESSINGS) ×1 IMPLANT
DRAIN PENROSE 1/4X12 LTX STRL (WOUND CARE) ×2 IMPLANT
DRAIN PENROSE 5/8X18 LTX STRL (WOUND CARE) IMPLANT
DRAPE LAPAROTOMY 77X122 PED (DRAPES) ×2 IMPLANT
DRSG GAUZE FLUFF 36X18 (GAUZE/BANDAGES/DRESSINGS) ×2 IMPLANT
DRSG TEGADERM 4X4.75 (GAUZE/BANDAGES/DRESSINGS) IMPLANT
DRSG TELFA 4X3 1S NADH ST (GAUZE/BANDAGES/DRESSINGS) IMPLANT
ELECT REM PT RETURN 9FT ADLT (ELECTROSURGICAL) ×2
ELECTRODE REM PT RTRN 9FT ADLT (ELECTROSURGICAL) ×1 IMPLANT
GLOVE SURG ENC MOIS LTX SZ6.5 (GLOVE) ×2 IMPLANT
GLOVE SURG UNDER POLY LF SZ6.5 (GLOVE) ×8 IMPLANT
GOWN STRL REUS W/ TWL LRG LVL3 (GOWN DISPOSABLE) ×2 IMPLANT
GOWN STRL REUS W/TWL LRG LVL3 (GOWN DISPOSABLE) ×4
KIT TURNOVER KIT A (KITS) ×2 IMPLANT
LABEL OR SOLS (LABEL) ×2 IMPLANT
MANIFOLD NEPTUNE II (INSTRUMENTS) ×2 IMPLANT
NDL HPO THNWL 1X22GA REG BVL (NEEDLE) ×1 IMPLANT
NEEDLE SAFETY 22GX1 (NEEDLE) ×2
PACK BASIN MINOR ARMC (MISCELLANEOUS) ×2 IMPLANT
SPONGE KITTNER 5P (MISCELLANEOUS) ×2 IMPLANT
STRIP CLOSURE SKIN 1/2X4 (GAUZE/BANDAGES/DRESSINGS) IMPLANT
SUPPORETR ATHLETIC LG (MISCELLANEOUS) ×1 IMPLANT
SUPPORTER ATHLETIC LG (MISCELLANEOUS) ×2
SUT MNCRL 4-0 (SUTURE) ×2
SUT MNCRL 4-0 27XMFL (SUTURE) ×1
SUT SILK 0 (SUTURE) ×2
SUT SILK 0 30XBRD TIE 6 (SUTURE) ×1 IMPLANT
SUT SILK 0 SH 30 (SUTURE) ×2 IMPLANT
SUT SILK 2 0 SH (SUTURE) IMPLANT
SUT VIC AB 3-0 SH 27 (SUTURE) ×4
SUT VIC AB 3-0 SH 27X BRD (SUTURE) ×2 IMPLANT
SUT VIC AB 4-0 FS2 27 (SUTURE) IMPLANT
SUT VICRYL PLUS ABS 0 54 (SUTURE) ×2 IMPLANT
SUTURE MNCRL 4-0 27XMF (SUTURE) ×1 IMPLANT
SYR 20ML LL LF (SYRINGE) ×2 IMPLANT
SYR BULB IRRIG 60ML STRL (SYRINGE) ×2 IMPLANT
WATER STERILE IRR 1000ML POUR (IV SOLUTION) ×2 IMPLANT

## 2020-06-24 NOTE — Interval H&P Note (Signed)
UROLOGY H&P UPDATE  Agree with prior H&P dated 06/23/20.  Cardiac: RRR Lungs: CTA bilaterally  Laterality: RIGHT Procedure: Radical orchiectomy  Informed consent obtained, we specifically discussed the risks of bleeding, infection, post-operative pain, need for additional procedures, further treatments based on pathology results and completion of staging imaging with CT of the chest abdomen and pelvis.  Billey Co, MD 06/24/2020

## 2020-06-24 NOTE — Transfer of Care (Signed)
Immediate Anesthesia Transfer of Care Note  Patient: Jestin Burbach  Procedure(s) Performed: ORCHIECTOMY (Right )  Patient Location: PACU  Anesthesia Type:General  Level of Consciousness: drowsy and patient cooperative  Airway & Oxygen Therapy: Patient Spontanous Breathing and Patient connected to face mask oxygen  Post-op Assessment: Report given to RN and Post -op Vital signs reviewed and stable  Post vital signs: Reviewed and stable  Last Vitals:  Vitals Value Taken Time  BP 118/73 06/24/20 1649  Temp 36.6 C 06/24/20 1649  Pulse 93 06/24/20 1653  Resp 16 06/24/20 1653  SpO2 99 % 06/24/20 1653  Vitals shown include unvalidated device data.  Last Pain:  Vitals:   06/24/20 1649  TempSrc:   PainSc: Asleep         Complications: No complications documented.

## 2020-06-24 NOTE — Discharge Instructions (Addendum)
Orchiectomy, Care After This sheet gives you information about how to care for yourself after your procedure. Your health care provider may also give you more specific instructions. If you have problems or questions, contact your health care provider. What can I expect after the procedure? After the procedure, it is common to have:  Pain and bruising in your genital or groin area.  Some swelling and redness in your genital or groin area.  Fluid pooling in the area where your testicles were removed (seroma).  Depression or mood changes.  Fatigue.  Hot flashes. Follow these instructions at home: Managing pain and swelling  If directed, put ice on the affected area: ? Put ice in a plastic bag. ? Place a towel between your skin and the bag. ? Leave the ice on for 20 minutes, 2-3 times a day.  Wear scrotal support as told by your health care provider.  To relieve pressure and pain when sitting, you may use a donut cushion if directed by your health care provider. Incision care  Follow instructions from your health care provider about how to take care of your incision. Make sure you: ? Wash your hands with soap and water for at least 20 seconds before and after you change your bandage (dressing). If soap and water are not available, use hand sanitizer. ? Change your dressing once a day, or as often as told by your health care provider. If the dressing sticks to your incision area:  Use warm, soapy water to dampen the dressing.  When the dressing becomes loose, lift it from the incision area. Make sure that the incision stays closed. ? Leave stitches (sutures), skin glue, or adhesive strips in place. These skin closures may need to stay in place for 2 weeks or longer. If adhesive strip edges start to loosen and curl up, you may trim the loose edges. Do not remove adhesive strips completely unless your health care provider tells you to do that.  Keep your dressing dry until it has been  removed.  Check your incision area every day for signs of infection. Check for: ? More redness, swelling, or pain. ? More fluid or blood. ? Warmth. ? Pus or a bad smell.   Bathing  Do not take baths, swim, or use a hot tub until your health care provider approves. You may start taking showers two days after your procedure.  Do not rub your incision to dry it. Pat the area gently with a clean cloth or let it air-dry. Medicines  Take over-the-counter and prescription medicines only as told by your health care provider.  If you had both testicles removed, talk with your health care provider about taking a medicine to replace testosterone, one of the male hormones that your body will no longer make.  Ask your health care provider if the medicine prescribed to you: ? Requires you to avoid driving or using machinery. ? Can cause constipation. You may need to take these actions to prevent or treat constipation:  Drink enough fluid to keep your urine pale yellow.  Take over-the-counter or prescription medicines.  Eat foods that are high in fiber, such as beans, whole grains, and fresh fruits and vegetables.  Limit foods that are high in fat and processed sugars, such as fried or sweet foods. Activity  If you were given a sedative during the procedure, it can affect you for several hours. Do not drive or operate machinery until your health care provider says that it is  safe.  Avoid activities that may cause your incision to open, such as jogging, playing sports, and straining during a bowel movement. Ask your health care provider what activities are safe for you.  Do not lift anything that is heavier than 10 lb (4.5 kg), or the limit that you are told, until your health care provider says that it is safe.  Do not engage in sexual activity until the area is healed and your health care provider approves. This could take up to 4 weeks. General instructions  Do not use any products that  contain nicotine or tobacco, such as cigarettes, e-cigarettes, and chewing tobacco. These can delay incision healing after surgery. If you need help quitting, ask your health care provider.  Keep all follow-up visits as told by your health care provider. This is important. Contact a health care provider if:  You have more bruising.  You have any of these signs of infection: ? More pain, swelling, or redness in your genital or groin area. ? More fluid or blood coming from your incision. ? Warmth coming from your incision. ? Pus or a bad smell coming from your incision.  You have constipation that is not helped by changing your diet or drinking more fluid.  You develop nausea or vomiting.  You cannot eat or drink without vomiting. Get help right away if:  You have dizziness or nausea that does not go away.  You have trouble breathing.  You have a wet (congested) cough.  You have a fever or shaking chills.  Your incision breaks open after the skin closures have been removed.  You are not able to urinate. Summary  After this procedure, it is common to have pain and bruising in your genital or groin area, or fluid pooling in the area where your testicles were removed.  You should check your incision area every day for signs of infection, such as more redness, swelling, or pain, more fluid or blood, warmth, pus, or a bad smell.  Avoid activities that may cause your incision to open, such as jogging, playing sports, and straining with bowel movements. Ask your health care provider what activities are safe for you.  You should not engage in sexual activity until the area is healed and your health care provider approves. This could take up to 4 weeks.  If you had both testicles removed, talk with your health care provider about taking a medicine to replace testosterone, one of the male hormones that your body will no longer make. This information is not intended to replace advice  given to you by your health care provider. Make sure you discuss any questions you have with your health care provider. Document Revised: 03/13/2019 Document Reviewed: 03/13/2019 Elsevier Patient Education  2021 [place]   1) The drugs that you were given will stay in your system until tomorrow so for the next 24 hours you should not:  A) Drive an automobile B) Make any legal decisions C) Drink any alcoholic beverage   2) You may resume regular meals tomorrow.  Today it is better to start with liquids and gradually work up to solid foods.  You may eat anything you prefer, but it is better to start with liquids, then soup and crackers, and gradually work up to solid foods.   3) Please notify your doctor immediately if you have any unusual bleeding, trouble breathing, redness and pain at the surgery site, drainage,  fever, or pain not relieved by medication.    4) Additional Instructions:        Please contact your physician with any problems or Same Day Surgery at [phone], Monday through Friday 6 am to 4 pm, or [place] [facility] number at [phone].

## 2020-06-24 NOTE — Anesthesia Procedure Notes (Signed)
Procedure Name: Intubation Date/Time: 06/24/2020 3:27 PM Performed by: Lowry Bowl, CRNA Pre-anesthesia Checklist: Patient identified, Emergency Drugs available, Suction available and Patient being monitored Patient Re-evaluated:Patient Re-evaluated prior to induction Oxygen Delivery Method: Circle system utilized Preoxygenation: Pre-oxygenation with 100% oxygen Induction Type: IV induction and Cricoid Pressure applied Ventilation: Mask ventilation without difficulty Laryngoscope Size: McGraph and 4 Grade View: Grade I Tube type: Oral Tube size: 7.5 mm Number of attempts: 1 Airway Equipment and Method: Stylet and Video-laryngoscopy Placement Confirmation: ETT inserted through vocal cords under direct vision,  positive ETCO2 and breath sounds checked- equal and bilateral Secured at: 21 cm Tube secured with: Tape Dental Injury: Teeth and Oropharynx as per pre-operative assessment

## 2020-06-24 NOTE — Anesthesia Postprocedure Evaluation (Signed)
Anesthesia Post Note  Patient: [name]  Procedure(s) Performed: ORCHIECTOMY (Right )  Patient location during evaluation: PACU Anesthesia Type: General Level of consciousness: awake and alert Pain management: pain level controlled Vital Signs Assessment: post-procedure vital signs reviewed and stable Respiratory status: spontaneous breathing, nonlabored ventilation and respiratory function stable Cardiovascular status: blood pressure returned to baseline and stable Postop Assessment: no apparent nausea or vomiting Anesthetic complications: no   No complications documented.   Last Vitals:  Vitals:   06/24/20 1732 06/24/20 1812  BP: 136/83 133/85  Pulse: 83 78  Resp: 16 16  Temp: (!) 36.3 C (!) 36.3 C  SpO2: 99% 98%    Last Pain:  Vitals:   06/24/20 1812  TempSrc: Temporal  PainSc: [place]

## 2020-06-24 NOTE — Op Note (Signed)
Date of procedure: 06/24/20  Preoperative diagnosis:  1. Right testicular mass  Postoperative diagnosis:  1. Same  Procedure: 1. Radical inguinal orchiectomy  Surgeon: Nickolas Madrid, MD  Assistant: John Giovanni MD  Anesthesia: General  Complications: None  Intraoperative findings:  1.  Uncomplicated right radical orchiectomy  EBL: Minimal  Specimens: Right testicle and spermatic cord  Drains: None  Indication: Alex Baldwin is a 40 y.o. patient with a painless right testicular mass and ultrasound consistent with malignancy.  Testicular tumor markers notable for elevated AFP of 221, mildly elevated hCG of 4, and normal LDH of 175.   After reviewing the management options for treatment, they elected to proceed with the above surgical procedure(s). We have discussed the potential benefits and risks of the procedure, side effects of the proposed treatment, the likelihood of the patient achieving the goals of the procedure, and any potential problems that might occur during the procedure or recuperation. Informed consent has been obtained.  Description of procedure:  The patient was taken to the operating room and general anesthesia was induced. SCDs were placed for DVT prophylaxis. The patient was placed in the supine position, prepped and draped in the usual sterile fashion, and preoperative antibiotics(Ancef) were administered. A preoperative time-out was performed.   A 5 cm incision was made midway between the pubic tubercle and ASIS and using electrocautery dissected down to the external oblique fascia.  This was very tenuous.  This was sharply opened taking care to avoid the ilioinguinal nerve.  The spermatic cord was identified and encircled with a Penrose drain and clamped proximally to reduce risk of proximal spread.  The spermatic cord was then followed down to the testicle and dissected free until only the gubernaculum remained.  This was incised using electrocautery and  meticulous hemostasis achieved in the scrotum.  The spermatic cord was freed up as proximally as possible up to the diversion of the vas deferens.  A clean towel was placed on the field.  The spermatic cord was divided into 2 packets and both were ligated with two 0-silk ties, and the tails were left long in case of need for possible RPLD in the future.  The specimen was handed off, and the wound was copiously irrigated with water.  At this point we changed into new gloves, and the wound was again irrigated.  Meticulous hemostasis was achieved in the wound bed.  A layer of Zynrelief local anesthetic was used to cover the deep tissues and scrotum.  A running 2-0 Vicryl was used to close the external oblique fascia, which again was quite tenuous.  Another layer of Zynrelief local anesthetic was used to coat the more superficial tissues.  Scarpa's was closed using a running 2-0 Vicryl, and a running 4-0 Monocryl was used to close the skin.  Dermabond was applied.  Scrotal fluffs and a jockstrap were applied.  Disposition: Stable to PACU  Plan: Will follow up for pathology results and CT C/A/P to complete staging  Nickolas Madrid, MD

## 2020-06-24 NOTE — Anesthesia Preprocedure Evaluation (Addendum)
Anesthesia Evaluation  Patient identified by MRN, date of birth, ID band Patient awake    Reviewed: Allergy & Precautions, NPO status , Patient's Chart, lab work & pertinent test results  Airway Mallampati: II  TM Distance: >3 FB     Dental  (+) Teeth Intact   Pulmonary neg pulmonary ROS,    Pulmonary exam normal        Cardiovascular negative cardio ROS Normal cardiovascular exam     Neuro/Psych negative neurological ROS  negative psych ROS   GI/Hepatic Neg liver ROS,   Endo/Other    Renal/GU stones  negative genitourinary   Musculoskeletal negative musculoskeletal ROS (+)   Abdominal Normal abdominal exam  (+)   Peds negative pediatric ROS (+)  Hematology negative hematology ROS (+)   Anesthesia Other Findings Past Medical History: No date: Kidney stones  Reproductive/Obstetrics                             Anesthesia Physical Anesthesia Plan  ASA: II  Anesthesia Plan: General   Post-op Pain Management:    Induction: Intravenous  PONV Risk Score and Plan:   Airway Management Planned: Oral ETT  Additional Equipment:   Intra-op Plan:   Post-operative Plan: Extubation in OR  Informed Consent: I have reviewed the patients History and Physical, chart, labs and discussed the procedure including the risks, benefits and alternatives for the proposed anesthesia with the patient or authorized representative who has indicated his/her understanding and acceptance.     Dental advisory given  Plan Discussed with: CRNA and Surgeon  Anesthesia Plan Comments: (ETT per request of surgeon.)       Anesthesia Quick Evaluation

## 2020-06-25 ENCOUNTER — Encounter: Payer: Self-pay | Admitting: Urology

## 2020-06-27 ENCOUNTER — Encounter: Payer: Self-pay | Admitting: Urology

## 2020-06-29 LAB — CULTURE, URINE COMPREHENSIVE

## 2020-06-30 ENCOUNTER — Other Ambulatory Visit: Payer: Self-pay

## 2020-06-30 ENCOUNTER — Telehealth (INDEPENDENT_AMBULATORY_CARE_PROVIDER_SITE_OTHER): Payer: Self-pay | Admitting: Urology

## 2020-06-30 DIAGNOSIS — D4959 Neoplasm of unspecified behavior of other genitourinary organ: Secondary | ICD-10-CM

## 2020-06-30 NOTE — Progress Notes (Signed)
    Urology telephone note  40 year old male status post right inguinal radical orchiectomy for 4 cm testicular tumor on 06/24/2020.  AFP was significantly elevated preop at 221, and beta-hCG mildly elevated at 4.  He is doing well since surgery and pain is minimal and he denies problems.  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  I ordered CT chest abdomen pelvis with contrast to complete staging.  We discussed that further treatments determine the next steps in care, and we also will need to repeat tumor markers and 3 to 4 weeks.  We will call with pathology results when finalized, as well as with CT findings   Billey Co, MD  [facility] [address], [place], [place] [address] [phone]

## 2020-07-04 ENCOUNTER — Other Ambulatory Visit: Payer: Self-pay | Admitting: Anatomic Pathology & Clinical Pathology

## 2020-07-04 LAB — SURGICAL PATHOLOGY

## 2020-07-06 ENCOUNTER — Telehealth (INDEPENDENT_AMBULATORY_CARE_PROVIDER_SITE_OTHER): Payer: Self-pay | Admitting: Urology

## 2020-07-06 ENCOUNTER — Other Ambulatory Visit: Payer: Self-pay

## 2020-07-06 DIAGNOSIS — C6211 Malignant neoplasm of descended right testis: Secondary | ICD-10-CM

## 2020-07-06 NOTE — Progress Notes (Signed)
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  Has not yet completed staging imaging with CT chest abdomen pelvis, I reemphasized the importance of this  Has lab visit in 3 weeks for repeat tumor markers(AFP was elevated preop at 221 and beta-hCG mildly elevated at 4)  We reviewed the AUA guidelines regarding treatment of mixed germ cell tumors and multiple treatment options.  We also discussed that staging imaging and repeat lab work needs to be completed before finalizing a treatment strategy.  We touched on active surveillance, or PID, and even a cycle of chemotherapy as a treatment strategy.  He prefers surveillance if imaging and tumor markers are negative.   We will call with CT results and repeat tumor markers   Nickolas Madrid, MD 07/06/2020

## 2020-07-07 ENCOUNTER — Other Ambulatory Visit: Payer: Self-pay

## 2020-07-08 NOTE — Progress Notes (Signed)
Tumor Board Documentation  Alex Baldwin was presented by Dr Glori Luis at our Tumor Board on 07/07/2020, which included representatives from medical oncology,radiation oncology,internal medicine,navigation,pathology,radiology,surgical,pharmacy,research,palliative care.  Alex Baldwin currently presents as an Alex Baldwin Alex Baldwin,for new positive pathology with history of the following treatments: surgical intervention(s).  Additionally, we reviewed previous medical and familial history, history of present illness, and recent lab results along with all available histopathologic and imaging studies. The tumor board considered available treatment options and made the following recommendations: Additional screening (CT Abd/Pelvis, Repeat Tumor Markers) Surveillence vs Surgery, Possible chemotherapy, Refer to Medical Oncology  The following procedures/referrals were also placed: No orders of the defined types were placed in this encounter.   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  Tumor board is a meeting of clinicians from various specialty areas who evaluate and discuss patients for whom a multidisciplinary approach is being considered. Final determinations in the plan of care are those of the provider(s). The responsibility for follow up of recommendations given during tumor board is that of the provider.   Today's extended care, comprehensive team conference, Alex Baldwin was not present for the discussion and was not examined.   Multidisciplinary Tumor Board is a multidisciplinary case peer review process.  Decisions discussed in the Multidisciplinary Tumor Board reflect the opinions of the specialists present at the conference without having examined the patient.  Ultimately, treatment and diagnostic decisions  rest with the primary provider(s) and the patient.

## 2020-07-20 ENCOUNTER — Other Ambulatory Visit: Payer: Self-pay

## 2020-07-20 ENCOUNTER — Ambulatory Visit
Admission: RE | Admit: 2020-07-20 | Discharge: 2020-07-20 | Disposition: A | Discharge status: Home / Self Care | Payer: BC Managed Care – PPO | Source: Ambulatory Visit | Attending: Urology | Admitting: Urology

## 2020-07-20 DIAGNOSIS — D4959 Neoplasm of unspecified behavior of other genitourinary organ: Secondary | ICD-10-CM | POA: Insufficient documentation

## 2020-07-20 MED ORDER — IOHEXOL 300 MG/ML  SOLN
100.0000 mL | Freq: Once | INTRAMUSCULAR | Status: AC | PRN
  Administered 2020-07-20: 100 mL via INTRAVENOUS

## 2020-08-01 ENCOUNTER — Other Ambulatory Visit: Payer: Self-pay

## 2020-08-05 ENCOUNTER — Other Ambulatory Visit: Payer: BC Managed Care – PPO

## 2020-09-08 ENCOUNTER — Telehealth: Payer: Self-pay

## 2020-09-08 NOTE — Telephone Encounter (Signed)
-----   Message from Billey Co, MD sent at 09/05/2020  5:11 PM EDT ----- This patient has still not followed up for his post orchiectomy tumor labs to confirm normalized, please encourage him again to get these labs drawn.  Orders are in, and I can call with the results  Nickolas Madrid, MD 09/05/2020

## 2020-09-08 NOTE — Telephone Encounter (Signed)
Called pt no answer. Left detailed message for pt informing him of the need to have labs drawn. Advised pt to call back to schedule lab appt.

## 2020-09-09 ENCOUNTER — Telehealth: Payer: Self-pay | Admitting: Urology

## 2020-09-09 NOTE — Telephone Encounter (Signed)
error 

## 2020-09-09 NOTE — Telephone Encounter (Signed)
Patient called back and rescheduled his missed lab appt  [name]

## 2020-10-04 ENCOUNTER — Other Ambulatory Visit: Payer: BC Managed Care – PPO

## 2020-10-04 ENCOUNTER — Other Ambulatory Visit: Payer: Self-pay

## 2020-10-04 DIAGNOSIS — D4959 Neoplasm of unspecified behavior of other genitourinary organ: Secondary | ICD-10-CM

## 2020-10-05 LAB — BETA HCG QUANT (REF LAB): hCG Quant: <1 m[IU]/mL (ref 0–3)

## 2020-10-05 LAB — LACTATE DEHYDROGENASE: LDH: 161 IU/L (ref 121–224)

## 2020-10-05 LAB — AFP TUMOR MARKER: AFP, Serum, Tumor Marker: 1.5 ng/mL (ref 0.0–6.9)

## 2020-10-12 ENCOUNTER — Telehealth: Payer: Self-pay

## 2020-10-12 DIAGNOSIS — C6211 Malignant neoplasm of descended right testis: Secondary | ICD-10-CM

## 2020-10-12 NOTE — Telephone Encounter (Signed)
-----   Message from Billey Co, MD sent at 10/12/2020  9:33 AM EDT ----- Doristine Devoid news, tumor markers have all normalized after orchiectomy.  We will start surveillance as we have discussed previously with imaging and lab work  Please schedule follow-up in 3 months with CT abdomen/pelvis with contrast, chest x-ray, and hCG quantitative, AFP, LDH prior, thanks  Nickolas Madrid, MD 10/12/2020

## 2020-10-14 NOTE — Telephone Encounter (Signed)
Patient notified, apt made and orders placed

## 2020-10-18 ENCOUNTER — Emergency Department
Admission: EM | Admit: 2020-10-18 | Discharge: 2020-10-18 | Disposition: A | Discharge status: Home / Self Care | Payer: BC Managed Care – PPO | Attending: Emergency Medicine | Admitting: Emergency Medicine

## 2020-10-18 ENCOUNTER — Emergency Department: Payer: BC Managed Care – PPO

## 2020-10-18 ENCOUNTER — Other Ambulatory Visit: Payer: Self-pay

## 2020-10-18 ENCOUNTER — Telehealth: Payer: Self-pay | Admitting: Nurse Practitioner

## 2020-10-18 ENCOUNTER — Encounter: Payer: Self-pay | Admitting: Emergency Medicine

## 2020-10-18 DIAGNOSIS — R0789 Other chest pain: Secondary | ICD-10-CM | POA: Diagnosis not present

## 2020-10-18 LAB — COMPREHENSIVE METABOLIC PANEL
ALT: 28 U/L (ref 0–44)
AST: 24 U/L (ref 15–41)
Albumin: 4.4 g/dL (ref 3.5–5.0)
Alkaline Phosphatase: 61 U/L (ref 38–126)
Anion gap: 4 — ABNORMAL LOW (ref 5–15)
BUN: 23 mg/dL — ABNORMAL HIGH (ref 6–20)
CO2: 30 mmol/L (ref 22–32)
Calcium: 9.3 mg/dL (ref 8.9–10.3)
Chloride: 105 mmol/L (ref 98–111)
Creatinine, Ser: 0.91 mg/dL (ref 0.61–1.24)
GFR, Estimated: >60 mL/min (ref >60)
Glucose, Bld: 92 mg/dL (ref 70–99)
Potassium: 3.9 mmol/L (ref 3.5–5.1)
Sodium: 139 mmol/L (ref 135–145)
Total Bilirubin: 1.1 mg/dL (ref 0.3–1.2)
Total Protein: 7.7 g/dL (ref 6.5–8.1)

## 2020-10-18 LAB — CBC WITH DIFFERENTIAL/PLATELET
Abs Immature Granulocytes: 0.02 10*3/uL (ref 0.00–0.07)
Basophils Absolute: 0.1 10*3/uL (ref 0.0–0.1)
Basophils Relative: 1 %
Eosinophils Absolute: 0.3 10*3/uL (ref 0.0–0.5)
Eosinophils Relative: 4 %
HCT: 44.2 % (ref 39.0–52.0)
Hemoglobin: 14.9 g/dL (ref 13.0–17.0)
Immature Granulocytes: 0 %
Lymphocytes Relative: 35 %
Lymphs Abs: 2.5 10*3/uL (ref 0.7–4.0)
MCH: 29.9 pg (ref 26.0–34.0)
MCHC: 33.7 g/dL (ref 30.0–36.0)
MCV: 88.6 fL (ref 80.0–100.0)
Monocytes Absolute: 0.5 10*3/uL (ref 0.1–1.0)
Monocytes Relative: 7 %
Neutro Abs: 3.7 10*3/uL (ref 1.7–7.7)
Neutrophils Relative %: 53 %
Platelets: 217 10*3/uL (ref 150–400)
RBC: 4.99 MIL/uL (ref 4.22–5.81)
RDW: 13.5 % (ref 11.5–15.5)
WBC: 7 10*3/uL (ref 4.0–10.5)
nRBC: 0 % (ref 0.0–0.2)

## 2020-10-18 LAB — TROPONIN I (HIGH SENSITIVITY)
Troponin I (High Sensitivity): 5 ng/L (ref <18)
Troponin I (High Sensitivity): 4 ng/L (ref <18)

## 2020-10-18 MED ORDER — NAPROXEN 500 MG PO TABS
500.0000 mg | ORAL_TABLET | Freq: Once | ORAL | Status: AC
  Administered 2020-10-18: 500 mg via ORAL
  Filled 2020-10-18: qty 1

## 2020-10-18 MED ORDER — ACETAMINOPHEN 500 MG PO TABS
1000.0000 mg | ORAL_TABLET | Freq: Once | ORAL | Status: AC
  Administered 2020-10-18: 1000 mg via ORAL
  Filled 2020-10-18: qty 2

## 2020-10-18 NOTE — Telephone Encounter (Signed)
Pt is scheduled for the soonest apt.

## 2020-10-18 NOTE — Discharge Instructions (Addendum)
Use Tylenol for pain and fevers.  Up to 1000 mg per dose, up to 4 times per day.  Do not take more than 4000 mg of Tylenol/acetaminophen within 24 hours..  Use naproxen/Aleve for anti-inflammatory pain relief. Use up to 500mg  every 12 hours. Do not take more frequently than this. Do not use other NSAIDs (ibuprofen, Advil) while taking this medication. It is safe to take Tylenol with this.   Return to the ED with chest pain and fevers, chest pain and passing out or severely worsening symptoms.

## 2020-10-18 NOTE — ED Notes (Signed)
See triage note  Presents with right side chest pain which is moving into his back  States pain started yesterday  Denies any trauma,n/v or diaphoresis

## 2020-10-18 NOTE — ED Triage Notes (Signed)
Patient ambulatory to triage with steady gait, without difficulty or distress noted; pt reports for several months having right sided CP radiating into back and rt arm; denies any accomp symptoms

## 2020-10-18 NOTE — ED Provider Notes (Signed)
[facility] Emergency Department Provider Note ____________________________________________   None    (approximate)  I have reviewed the triage vital signs and the nursing notes.  HISTORY  Chief Complaint No chief complaint on file.   HPI Alex Baldwin is a 40 y.o. malewho presents to the ED for evaluation of chronic chest pain.  Chart review indicates history of obesity and nephrolithiasis.  S/p right orchiectomy the germ cell tumor.  Patient presents to the ED for evaluation of 3 months of right-sided chest pain.  He reports a sharp pain, occasionally radiating down his right arm, it is intermittent.  Lasting up to an hour before self resolving.  Denies coexisting symptoms alongside it such as shortness of breath, diaphoresis, syncope, nausea or vomiting.  Denies postprandial symptoms.  Reports his symptoms are most often while he is at work, while he is driving or while he is laying in bed at night.  Denies exertional symptoms.  Reports the similar symptoms have been feeling more severe in intensity over the past few days, so he presents to the ED this morning for evaluation. Non-smoker, no family history of early cardiac death or ACS.   Past Medical History:  Diagnosis Date   Kidney stones     There are no problems to display for this patient.   Past Surgical History:  Procedure Laterality Date   LITHOTRIPSY  2004   ORCHIECTOMY Right 06/24/2020   Procedure: ORCHIECTOMY;  Surgeon: Billey Co, MD;  Location: [facility] ORS;  Service: Urology;  Laterality: Right;   ORCHIECTOMY      Prior to Admission medications   Medication Sig Start Date End Date Taking? Authorizing Provider  omeprazole (PRILOSEC) 20 MG capsule Take 20 mg by mouth daily.    [provider]    Allergies Patient has no known allergies.  No family history on file.  Social History Social History   Tobacco Use   Smoking status: Never   Smokeless tobacco: Never   Vaping Use   Vaping Use: Never used  Substance Use Topics   Alcohol use: Never    Review of Systems  Constitutional: No fever/chills Eyes: No visual changes. ENT: No sore throat. Cardiovascular: Positive for chest pain. Respiratory: Denies shortness of breath. Gastrointestinal: No abdominal pain.  No nausea, no vomiting.  No diarrhea.  No constipation. Genitourinary: Negative for dysuria. Musculoskeletal: Negative for back pain. Skin: Negative for rash. Neurological: Negative for headaches, focal weakness or numbness.  ____________________________________________   PHYSICAL EXAM:  VITAL SIGNS: Vitals:   10/18/20 0345 10/18/20 0628  BP: (!) 128/93 121/85  Pulse: 70 63  Resp: 18 18  Temp: 98 F (36.7 C) 98.7 F (37.1 C)  SpO2: 100% 100%    Constitutional: Alert and oriented. Well appearing and in no acute distress. Ambulatory with a normal gait.  Obese.  Conversational Eyes: Conjunctivae are normal. PERRL. EOMI. Head: Atraumatic. Nose: No congestion/rhinnorhea. Mouth/Throat: Mucous membranes are moist.  Oropharynx non-erythematous. Neck: No stridor. No cervical spine tenderness to palpation. Cardiovascular: Normal rate, regular rhythm. Grossly normal heart sounds.  Good peripheral circulation. Respiratory: Normal respiratory effort.  No retractions. Lungs CTAB. Gastrointestinal: Soft , nondistended, nontender to palpation. No CVA tenderness. Musculoskeletal: No lower extremity tenderness nor edema.  No joint effusions. No signs of acute trauma. No overlying rash or skin change at the site of his chest pain.  No tenderness to palpation. Neurologic:  Normal speech and language. No gross focal neurologic deficits are appreciated. No gait instability noted.  Cranial nerves II through XII intact 5/5 strength and sensation in all 4 extremities Skin:  Skin is warm, dry and intact. No rash noted. Psychiatric: Mood and affect are normal. Speech and behavior are  normal. ____________________________________________   LABS (all labs ordered are listed, but only abnormal results are displayed)  Labs Reviewed  COMPREHENSIVE METABOLIC PANEL - Abnormal; Notable for the following components:      Result Value   BUN 23 (*)    Anion gap 4 (*)    All other components within normal limits  CBC WITH DIFFERENTIAL/PLATELET  TROPONIN I (HIGH SENSITIVITY)  TROPONIN I (HIGH SENSITIVITY)   ____________________________________________  12 Lead EKG  Sinus rhythm, rate of 64 bpm.  Normal axis and intervals.  No evidence of acute ischemia. ____________________________________________  RADIOLOGY  ED MD interpretation: 2 view CXR reviewed by me without evidence of acute cardiopulmonary pathology.  Official radiology report(s): DG Chest 2 View  Result Date: 10/18/2020 CLINICAL DATA:  Chest pain EXAM: CHEST - 2 VIEW COMPARISON:  CT chest 07/20/2020 FINDINGS: The heart size and mediastinal contours are within normal limits. No focal consolidation. No pulmonary edema. No pleural effusion. No pneumothorax. No acute osseous abnormality. IMPRESSION: No active cardiopulmonary disease. Electronically Signed   By: Iven Finn M.D.   On: 10/18/2020 04:35    ____________________________________________   PROCEDURES and INTERVENTIONS  Procedure(s) performed (including Critical Care):  Procedures  Medications  acetaminophen (TYLENOL) tablet 1,000 mg (1,000 mg Oral Given 10/18/20 0744)  naproxen (NAPROSYN) tablet 500 mg (500 mg Oral Given 10/18/20 0744)    ____________________________________________   MDM / ED COURSE   Largely healthy 40 year old male presents to the ED with atypical chest pains, without evidence of acute derangements and possibly due to anxiety, amenable to outpatient management.  Normal vitals.  Exam reassuring without evidence of acute derangements.  No distress, signs of neurologic or vascular deficits, overlying rashes or skin changes  such as herpes zoster.  His work-up is benign without evidence of ACS, PTX, anemia or further acute derangements.  We will treat symptomatically, assist with establishing with a PCP, and discharge with return precautions.     ____________________________________________   FINAL CLINICAL IMPRESSION(S) / ED DIAGNOSES  Final diagnoses:  Other chest pain     ED Discharge Orders     None        [place]   Note:  This document was prepared using Dragon voice recognition software and may include unintentional dictation errors.    Vladimir Crofts, MD 10/18/20 [phone]

## 2020-10-18 NOTE — Telephone Encounter (Signed)
Copied from [place] [phone]. Topic: Appointment Scheduling - Scheduling Inquiry for Clinic >> Oct 18, 2020  8:38 AM Bayard Beaver wrote: Reason for CRM: Please call patient back if earlier appt available. I only had 07/20 available.

## 2020-11-13 ENCOUNTER — Encounter: Payer: Self-pay | Admitting: Nurse Practitioner

## 2020-11-13 DIAGNOSIS — Z87438 Personal history of other diseases of male genital organs: Secondary | ICD-10-CM | POA: Insufficient documentation

## 2020-11-13 DIAGNOSIS — N200 Calculus of kidney: Secondary | ICD-10-CM | POA: Insufficient documentation

## 2020-11-16 ENCOUNTER — Ambulatory Visit: Payer: BC Managed Care – PPO | Admitting: Nurse Practitioner

## 2021-01-05 ENCOUNTER — Other Ambulatory Visit: Payer: BC Managed Care – PPO

## 2021-01-12 ENCOUNTER — Ambulatory Visit: Payer: BC Managed Care – PPO | Admitting: Urology

## 2021-11-14 IMAGING — CT CT ABD-PELV W/ CM
2 of 5 series · 13 of 46 positions shown, 15 images · IV contrast (omnipaque)
Comparison: CT abdomen/pelvis dated 01/14/2018

CLINICAL DATA: Right testicular cancer, status post right
orchiectomy

EXAM:
CT CHEST, ABDOMEN, AND PELVIS WITH CONTRAST
TECHNIQUE: Multidetector CT imaging of the chest, abdomen and pelvis was
performed following the standard protocol during bolus
administration of intravenous contrast.
CONTRAST:  100mL OMNIPAQUE IOHEXOL 300 MG/ML  SOLN

[Series 2: axials cap 5.00 · axial · 0.76mm/px · z∈[-1578,-983]mm · 10 of 147 slices shown, 12 images]
[im 14/147  soft-tissue]
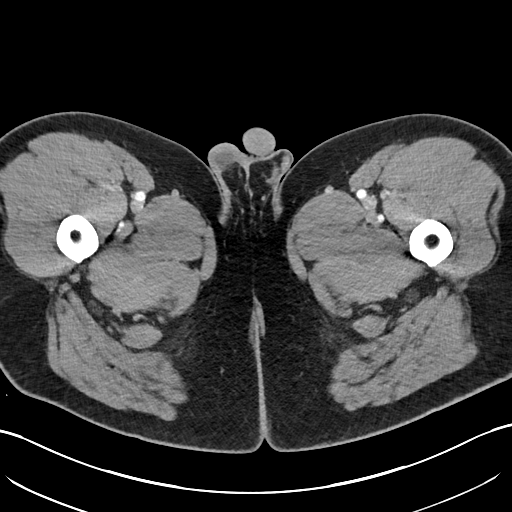
[im 14/147  bone]
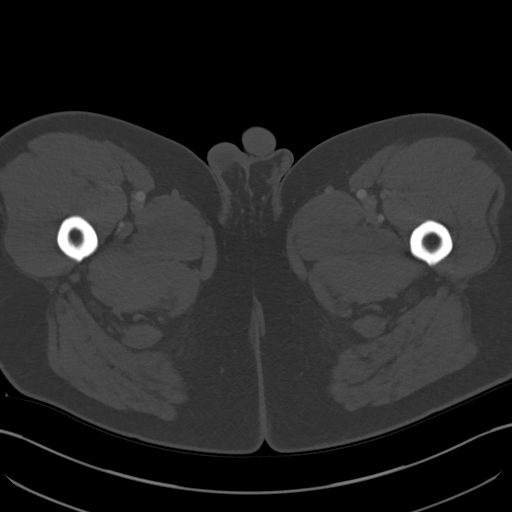
[im 27/147  soft-tissue]
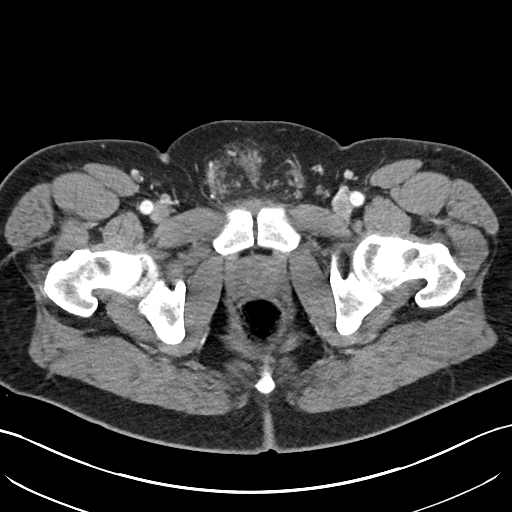
[im 40/147  soft-tissue]
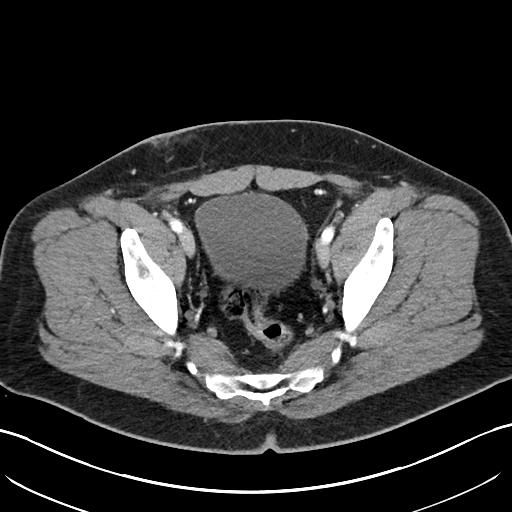
[im 54/147  soft-tissue]
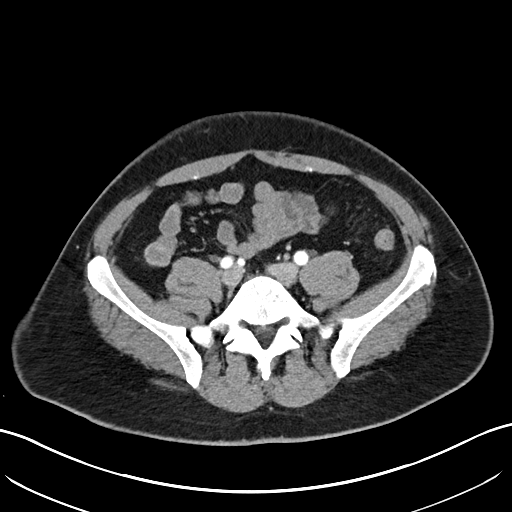
[im 67/147  soft-tissue]
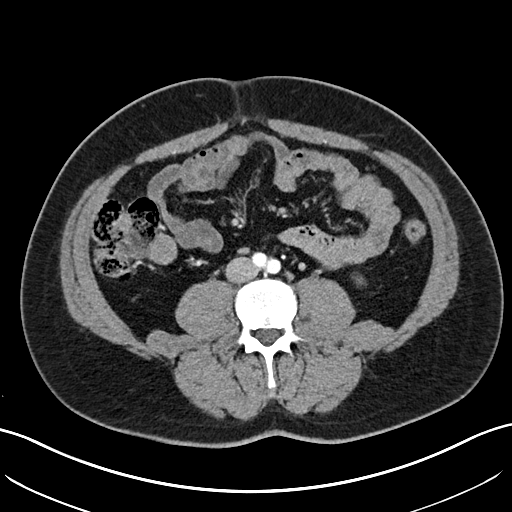
[im 80/147  soft-tissue]
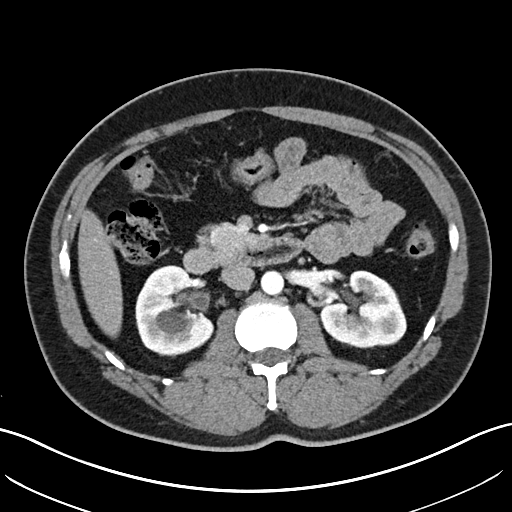
[im 93/147  soft-tissue]
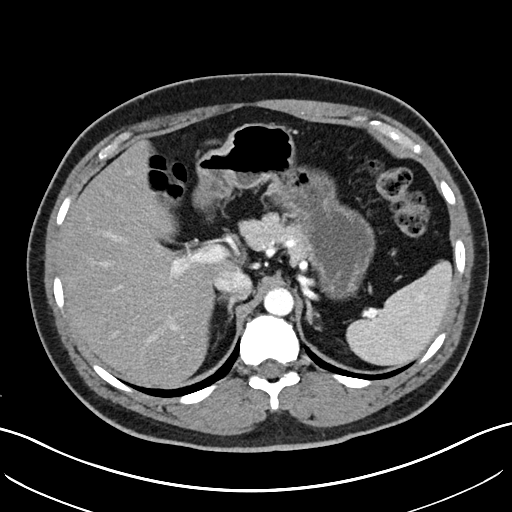
[im 107/147  soft-tissue]
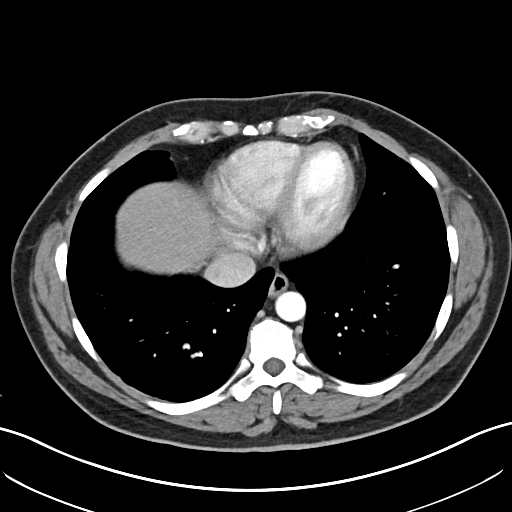
[im 120/147  soft-tissue]
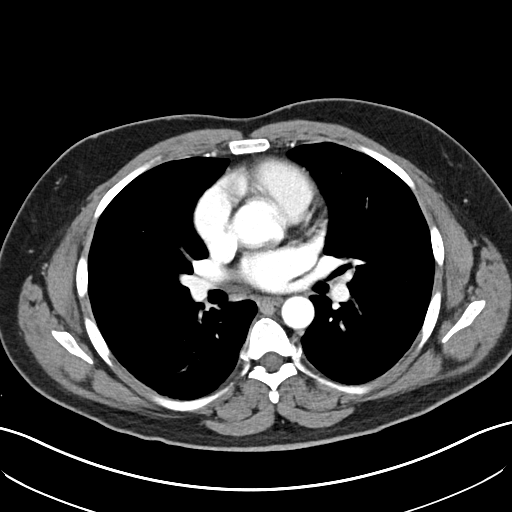
[im 120/147  bone]
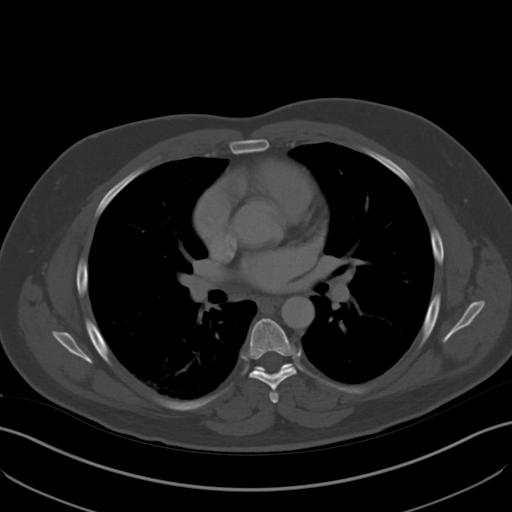
[im 133/147  soft-tissue]
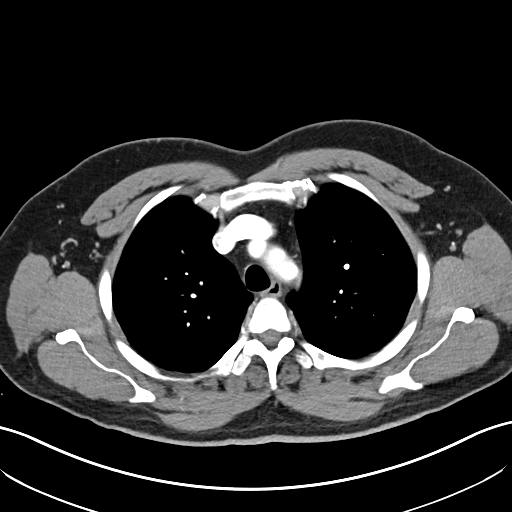

[Series 4: coronals cap 2.00 cor · coronal · 0.76mm/px · 3 of 152 slices shown]
[im 51/152  soft-tissue]
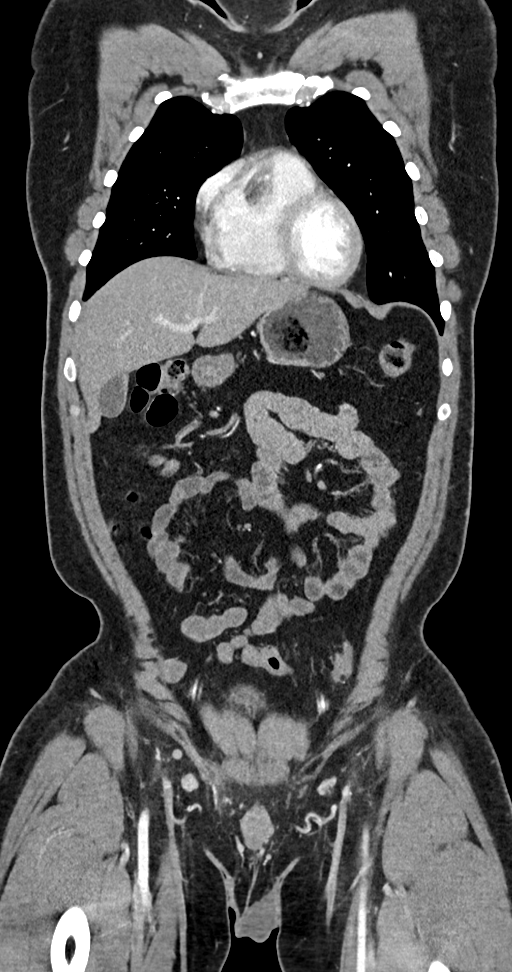
[im 68/152  soft-tissue]
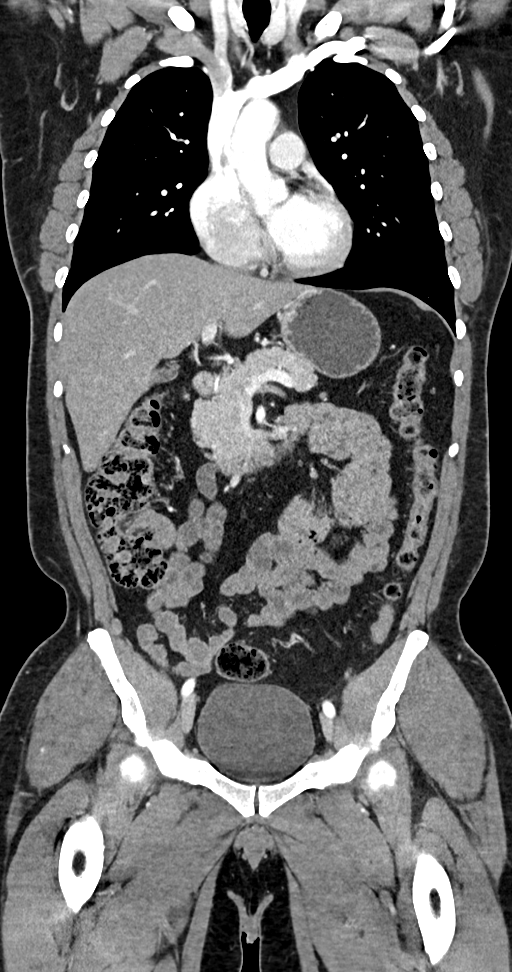
[im 84/152  soft-tissue]
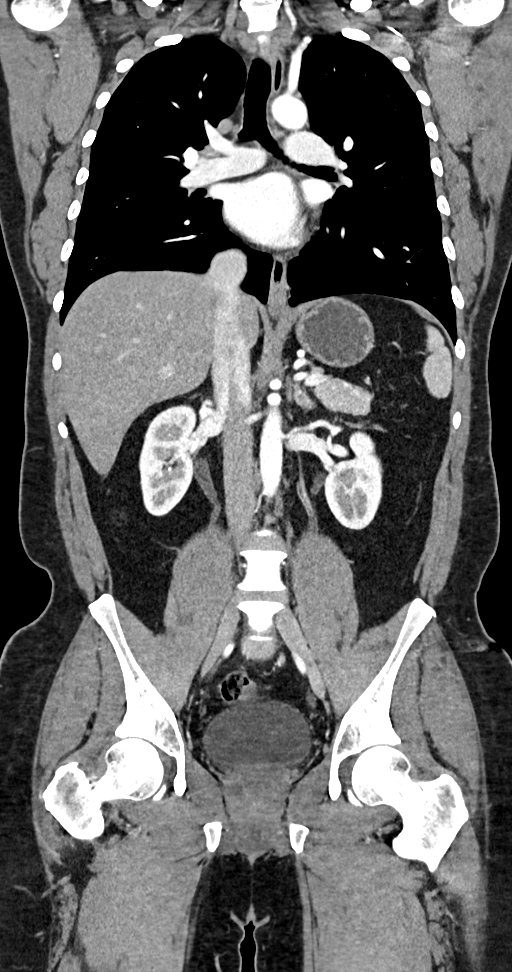

[13 of 46 positions shown; findings below may reference images not displayed]

FINDINGS: CT CHEST FINDINGS

Cardiovascular: Heart is normal in size.  No pericardial effusion.

No evidence of thoracic aortic aneurysm.

Mediastinum/Nodes: No suspicious mediastinal lymphadenopathy.

Visualized thyroid is unremarkable.

Lungs/Pleura: Mild dependent atelectasis in the right lower lobe.

No focal consolidation.

No suspicious pulmonary nodules.

No pleural effusion or pneumothorax.

Musculoskeletal: Very mild degenerative changes of the lower
thoracic spine.

CT ABDOMEN PELVIS FINDINGS

Hepatobiliary: Liver is within normal limits.

Gallbladder is unremarkable. No intrahepatic or extrahepatic ductal
dilatation.

Pancreas: Within normal limits.

Spleen: Within normal limits.

Adrenals/Urinary Tract: Adrenal glands are within normal limits.

Left kidney is within normal limits.

Developing staghorn calculus in the interpolar right kidney
measuring up to 2.4 cm (series 2/image 70), previously 2.1 cm. Three
additional nonobstructing right renal calculi measuring up to 10 mm
in the right upper pole. 9 mm right lower pole renal cyst (series
9/image 20) with additional posterior right lower pole renal sinus
cyst (series 9/image 19).

Bladder is within normal limits.

Stomach/Bowel: Stomach is within normal limits.

No evidence of bowel obstruction.

Prior appendectomy.

Vascular/Lymphatic: No evidence of abdominal aortic aneurysm.

No suspicious abdominopelvic lymphadenopathy. Few small para-aortic
nodes measuring up to 6 mm short axis (series 2/image 34), unchanged
from 7385 and within normal limits.

Reproductive: Prostate is within normal limits.

Postsurgical changes related to right orchiectomy.

Other: No abdominopelvic ascites.

Musculoskeletal: Visualized osseous structures are within normal
limits.
IMPRESSION: Postsurgical changes related to right orchiectomy.

No evidence of recurrent or metastatic disease.

Developing staghorn calculus in the interpolar right kidney
measuring up to 2.4 cm, previously 2.1 cm. Three additional
nonobstructing right renal calculi measuring up to 10 mm in the
right upper pole. Right renal sinus cyst without frank
hydronephrosis.

## 2022-02-12 IMAGING — CR DG CHEST 2V
1 series · 2 of 2 positions shown · non-contrast
Comparison: CT chest 07/20/2020

CLINICAL DATA: Chest pain

EXAM:
CHEST - 2 VIEW

[Series 1: dg chest 2 view · 0.14mm/px · 2 of 2 slices shown]
[im 1/2]
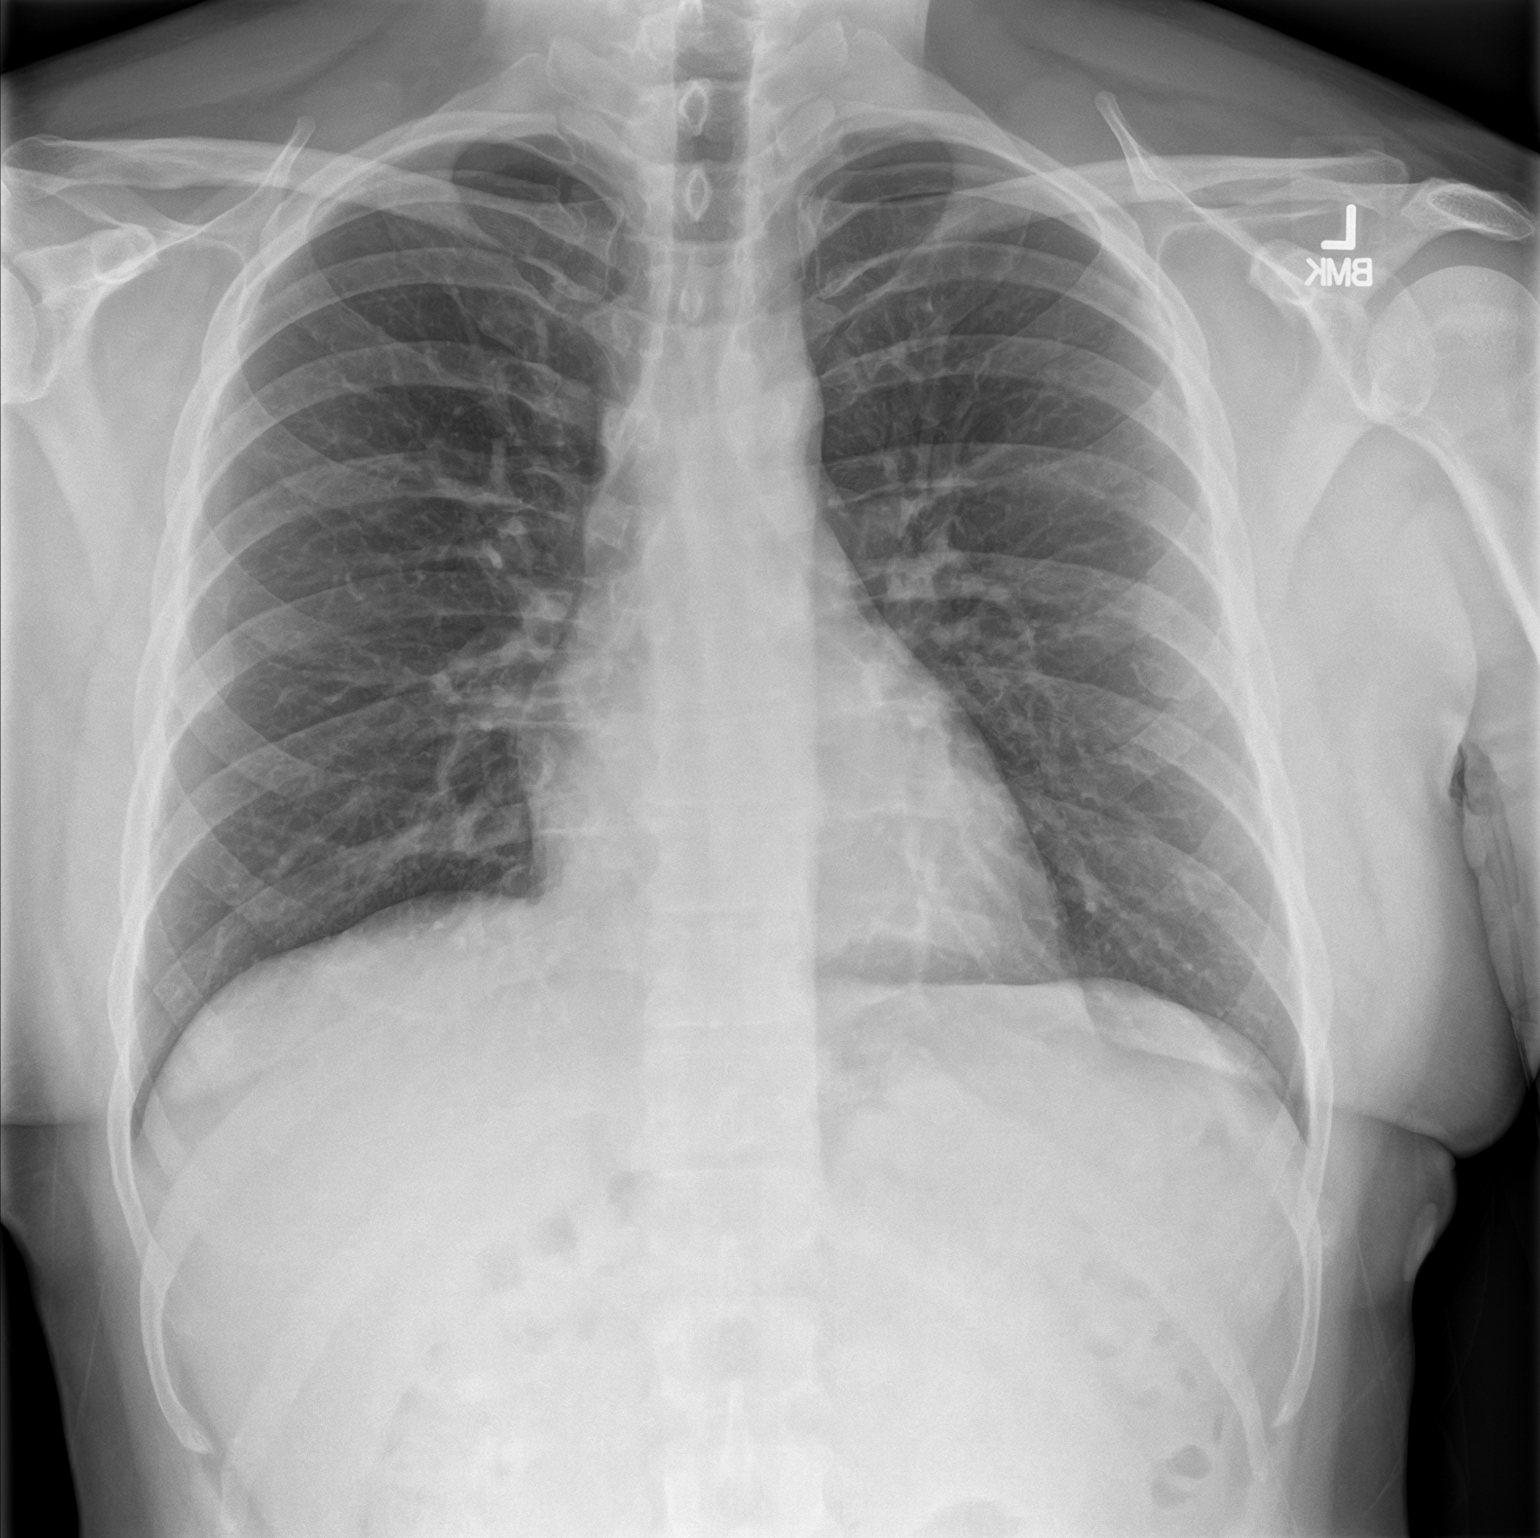
[im 2/2]
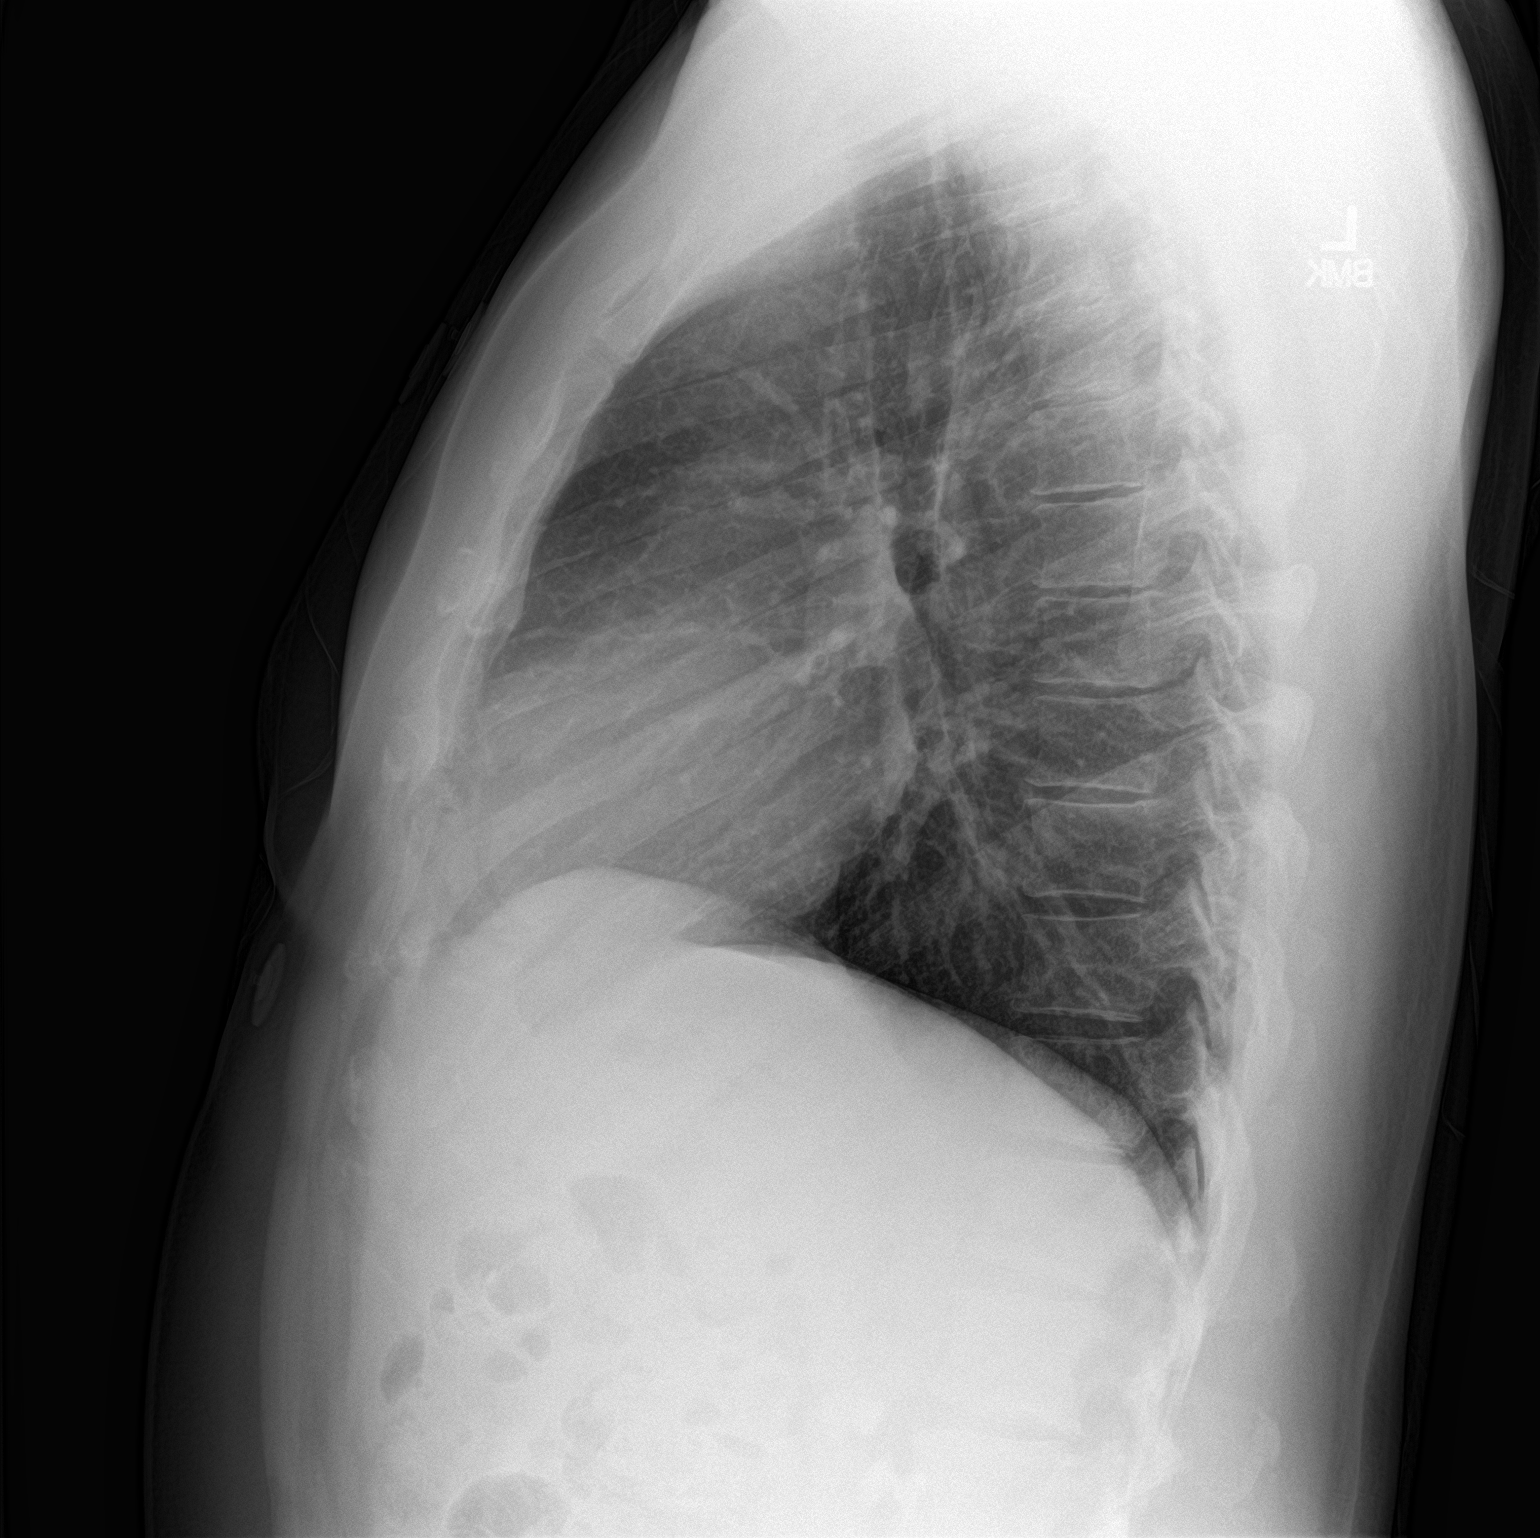

[2 of 2 positions shown; findings below may reference images not displayed]

FINDINGS: The heart size and mediastinal contours are within normal limits.

No focal consolidation. No pulmonary edema. No pleural effusion. No
pneumothorax.

No acute osseous abnormality.
IMPRESSION: No active cardiopulmonary disease.
